# Patient Record
Sex: Female | Born: 1950
Health system: Southern US, Community
[De-identification: ages and names within clinical notes are randomized; demographics above are authoritative.]

## PROBLEM LIST (undated history)

## (undated) DIAGNOSIS — K5792 Diverticulitis of intestine, part unspecified, without perforation or abscess without bleeding: Secondary | ICD-10-CM

## (undated) DIAGNOSIS — K219 Gastro-esophageal reflux disease without esophagitis: Secondary | ICD-10-CM

## (undated) DIAGNOSIS — E785 Hyperlipidemia, unspecified: Secondary | ICD-10-CM

## (undated) DIAGNOSIS — I38 Endocarditis, valve unspecified: Secondary | ICD-10-CM

## (undated) HISTORY — DX: Hyperlipidemia, unspecified: E78.5

## (undated) HISTORY — PX: ABDOMINAL HYSTERECTOMY: SHX81

---

## 2010-08-25 ENCOUNTER — Emergency Department (HOSPITAL_COMMUNITY)
Admission: EM | Admit: 2010-08-25 | Discharge: 2010-08-25 | Disposition: A | Discharge status: Home / Self Care | Payer: Self-pay | Attending: Emergency Medicine | Admitting: Emergency Medicine

## 2010-08-25 ENCOUNTER — Emergency Department (HOSPITAL_COMMUNITY): Payer: Self-pay

## 2010-08-25 DIAGNOSIS — M1710 Unilateral primary osteoarthritis, unspecified knee: Secondary | ICD-10-CM | POA: Insufficient documentation

## 2010-08-25 DIAGNOSIS — IMO0002 Reserved for concepts with insufficient information to code with codable children: Secondary | ICD-10-CM | POA: Insufficient documentation

## 2010-08-25 DIAGNOSIS — X500XXA Overexertion from strenuous movement or load, initial encounter: Secondary | ICD-10-CM | POA: Insufficient documentation

## 2010-08-25 DIAGNOSIS — I10 Essential (primary) hypertension: Secondary | ICD-10-CM | POA: Insufficient documentation

## 2013-01-16 ENCOUNTER — Emergency Department (HOSPITAL_COMMUNITY): Payer: BC Managed Care – PPO

## 2013-01-16 ENCOUNTER — Encounter (HOSPITAL_COMMUNITY): Payer: Self-pay | Admitting: Emergency Medicine

## 2013-01-16 ENCOUNTER — Emergency Department (HOSPITAL_COMMUNITY)
Admission: EM | Admit: 2013-01-16 | Discharge: 2013-01-16 | Disposition: A | Discharge status: Home / Self Care | Payer: BC Managed Care – PPO | Attending: Emergency Medicine | Admitting: Emergency Medicine

## 2013-01-16 DIAGNOSIS — R0602 Shortness of breath: Secondary | ICD-10-CM | POA: Insufficient documentation

## 2013-01-16 DIAGNOSIS — Z880 Allergy status to penicillin: Secondary | ICD-10-CM | POA: Insufficient documentation

## 2013-01-16 DIAGNOSIS — R42 Dizziness and giddiness: Secondary | ICD-10-CM | POA: Insufficient documentation

## 2013-01-16 DIAGNOSIS — R109 Unspecified abdominal pain: Secondary | ICD-10-CM | POA: Insufficient documentation

## 2013-01-16 DIAGNOSIS — K219 Gastro-esophageal reflux disease without esophagitis: Secondary | ICD-10-CM | POA: Insufficient documentation

## 2013-01-16 DIAGNOSIS — Z79899 Other long term (current) drug therapy: Secondary | ICD-10-CM | POA: Insufficient documentation

## 2013-01-16 DIAGNOSIS — R197 Diarrhea, unspecified: Secondary | ICD-10-CM | POA: Insufficient documentation

## 2013-01-16 DIAGNOSIS — R002 Palpitations: Secondary | ICD-10-CM | POA: Insufficient documentation

## 2013-01-16 DIAGNOSIS — Z7982 Long term (current) use of aspirin: Secondary | ICD-10-CM | POA: Insufficient documentation

## 2013-01-16 HISTORY — DX: Diverticulitis of intestine, part unspecified, without perforation or abscess without bleeding: K57.92

## 2013-01-16 HISTORY — DX: Gastro-esophageal reflux disease without esophagitis: K21.9

## 2013-01-16 LAB — BASIC METABOLIC PANEL
BUN: 13 mg/dL (ref 6–23)
CO2: 23 mEq/L (ref 19–32)
Chloride: 104 mEq/L (ref 96–112)
Creatinine, Ser: 0.76 mg/dL (ref 0.50–1.10)
GFR calc Af Amer: >90 mL/min (ref >90)
Potassium: 3.6 mEq/L (ref 3.5–5.1)

## 2013-01-16 LAB — CBC
HCT: 42.7 % (ref 36.0–46.0)
MCV: 84.6 fL (ref 78.0–100.0)
Platelets: 226 10*3/uL (ref 150–400)
RBC: 5.05 MIL/uL (ref 3.87–5.11)
WBC: 9.5 10*3/uL (ref 4.0–10.5)

## 2013-01-16 LAB — POCT I-STAT TROPONIN I

## 2013-01-16 LAB — PRO B NATRIURETIC PEPTIDE: Pro B Natriuretic peptide (BNP): 42 pg/mL (ref 0–125)

## 2013-01-16 NOTE — ED Notes (Addendum)
Pt c/o intermittent palpitations that are associated with dizziness, nausea, sob since Friday, reports one episode of chest tightness this am that went away after 10 minutes. Pt NSR on arrival to er, states that the palpitations are better at present, pt states that she switched her Genella Rife medication to nexium three weeks ago and wonders if the palpitations are a side effect.

## 2013-01-16 NOTE — ED Provider Notes (Signed)
CSN: [id number]     Arrival date & time 01/16/13  1108 History  This chart was scribed for Flint Melter, MD by Quintella Reichert, ED scribe.  This patient was seen in room APA10/APA10 and the patient's care was started at 11:45 AM.   Chief Complaint  Patient presents with  . Palpitations    The history is provided by the patient. No language interpreter was used.    HPI Comments: Sierra Greer is a 62 y.o. female who presents to the Emergency Department complaining of 2 days of intermittent heart palpitations with associated dizziness and SOB.  Pt describes palpitations as "my heart is doing a flippity floppity thing."  Dizziness is present whenever the palpitations occur even if she is at rest.  It has not prevented her from walking.  She denies prior h/o heart problems or persistent palpitations.  Pt also notes she has had intermittent severe sharp lower abdominal pain over the past week which has since resolved.  She has h/o diverticulitis but states this pain felt distinct.  She also complains of diarrhea.  Pt takes 2 low-dose aspirin/day.  Surgical history is limited to an abdominal hysterectomy in 1995.    Pt has no PCP.   Past Medical History  Diagnosis Date  . GERD (gastroesophageal reflux disease)   . Diverticulitis     Past Surgical History  Procedure Laterality Date  . Abdominal hysterectomy      No family history on file.   History  Substance Use Topics  . Smoking status: Never Smoker   . Smokeless tobacco: Not on file  . Alcohol Use: No    OB History   Grav Para Term Preterm Abortions TAB SAB Ect Mult Living                  Review of Systems  Respiratory: Positive for shortness of breath.   Cardiovascular: Positive for palpitations.  Gastrointestinal: Positive for abdominal pain and diarrhea.  Neurological: Positive for dizziness.  All other systems reviewed and are negative.     Allergies  Penicillins  Home Medications   Current Outpatient  Rx  Name  Route  Sig  Dispense  Refill  . aspirin EC 81 MG tablet   Oral   Take 162 mg by mouth daily.         . diphenhydrAMINE (BENADRYL) 25 MG tablet   Oral   Take 50 mg by mouth every 6 (six) hours as needed (Swelling, breathing).         [name] esomeprazole (NEXIUM) 20 MG capsule   Oral   Take 20 mg by mouth daily.         . Nutritional Supplements (ESTROVEN PO)   Oral   Take 1 tablet by mouth daily.           BP 148/71  Pulse 71  Temp(Src) 97.9 F (36.6 C) (Oral)  Resp 20  Ht 5\' 5"  (1.651 m)  Wt 250 lb (113.399 kg)  BMI 41.60 kg/m2  SpO2 100%  Physical Exam  Nursing note and vitals reviewed. Constitutional: She is oriented to person, place, and time. She appears well-developed and well-nourished.  HENT:  Head: Normocephalic and atraumatic.  Eyes: Conjunctivae and EOM are normal. Pupils are equal, round, and reactive to light.  Neck: Normal range of motion and phonation normal. Neck supple.  Cardiovascular: Normal rate, regular rhythm and intact distal pulses.   Pulmonary/Chest: Effort normal and breath sounds normal. She exhibits no tenderness.  Abdominal:  Soft. She exhibits no distension. There is no tenderness. There is no guarding.  Musculoskeletal: Normal range of motion.  Neurological: She is alert and oriented to person, place, and time. She exhibits normal muscle tone.  Skin: Skin is warm and dry.  Psychiatric: She has a normal mood and affect. Her behavior is normal. Judgment and thought content normal.    ED Course  Procedures (including critical care time)   Patient Vitals for the past 24 hrs:  BP Temp Temp src Pulse Resp SpO2 Height Weight  01/16/13 1400 128/55 mmHg - - 73 17 98 % - -  01/16/13 1336 137/63 mmHg 98.1 F (36.7 C) Oral 70 18 98 % - -  01/16/13 1200 131/59 mmHg - - 72 15 98 % - -  01/16/13 1124 148/71 mmHg 97.9 F (36.6 C) Oral 71 20 100 % 5\' 5"  (1.651 m) 250 lb (113.399 kg)    DIAGNOSTIC STUDIES: Oxygen Saturation is 100%  on room air, normal by my interpretation.    COORDINATION OF CARE: 11:52 AM-Discussed treatment plan which includes cardiac workup with pt at bedside and pt agreed to plan.    EKG Interpretation     Ventricular Rate:  69 PR Interval:  162 QRS Duration: 84 QT Interval:  406 QTC Calculation: 435 R Axis:   34 Text Interpretation:  Normal sinus rhythm Low voltage QRS Borderline ECG No previous ECGs available            Results for orders placed during the hospital encounter of 01/16/13  CBC      Result Value Range   WBC 9.5  4.0 - 10.5 K/uL   RBC 5.05  3.87 - 5.11 MIL/uL   Hemoglobin 14.5  12.0 - 15.0 g/dL   HCT 16.1  09.6 - 04.5 %   MCV 84.6  78.0 - 100.0 fL   MCH 28.7  26.0 - 34.0 pg   MCHC 34.0  30.0 - 36.0 g/dL   RDW 40.9  81.1 - 91.4 %   Platelets 226  150 - 400 K/uL  BASIC METABOLIC PANEL      Result Value Range   Sodium 138  135 - 145 mEq/L   Potassium 3.6  3.5 - 5.1 mEq/L   Chloride 104  96 - 112 mEq/L   CO2 23  19 - 32 mEq/L   Glucose, Bld 98  70 - 99 mg/dL   BUN 13  6 - 23 mg/dL   Creatinine, Ser 7.82  0.50 - 1.10 mg/dL   Calcium 9.8  8.4 - 95.6 mg/dL   GFR calc non Af Amer 88 (*) >90 mL/min   GFR calc Af Amer >90  >90 mL/min  PRO B NATRIURETIC PEPTIDE      Result Value Range   Pro B Natriuretic peptide (BNP) 42.0  0 - 125 pg/mL  POCT I-STAT TROPONIN I      Result Value Range   Troponin i, poc 0.00  0.00 - 0.08 ng/mL   Comment 3             Dg Chest Port 1 View  01/16/2013   CLINICAL DATA:  Palpitations  EXAM: PORTABLE CHEST - 1 VIEW  COMPARISON:  None.  FINDINGS: Cardiomegaly is noted. No acute infiltrate or pleural effusion. No pulmonary edema. Mild elevation of the right hemidiaphragm.  IMPRESSION: Cardiomegaly. No active disease.   Electronically Signed   By: Natasha Mead M.D.   On: 01/16/2013 12:03     MDM  1. Palpitations    Palpitations with nonspecific dizziness, which is not prolonged. The patient is stable for discharge. There is no  evidence for metabolic instability, significant cardiac abnormality, or vascular instability.  Nursing Notes Reviewed/ Care Coordinated, and agree without changes. Applicable Imaging Reviewed.  Interpretation of Laboratory Data incorporated into ED treatment   Plan: Home Medications- usual; Home Treatments and Observation- rest; return here if the recommended treatment, does not improve the symptoms; Recommended follow up- PCP of choice in one week for further evaluation and treatment       I personally performed the services described in this documentation, which was scribed in my presence. The recorded information has been reviewed and is accurate.      Flint Melter, MD 01/16/13 1536

## 2013-01-18 ENCOUNTER — Ambulatory Visit: Payer: Self-pay | Admitting: Family Medicine

## 2013-01-21 ENCOUNTER — Ambulatory Visit (INDEPENDENT_AMBULATORY_CARE_PROVIDER_SITE_OTHER): Payer: BC Managed Care – PPO | Admitting: Family Medicine

## 2013-01-21 ENCOUNTER — Encounter: Payer: Self-pay | Admitting: Family Medicine

## 2013-01-21 VITALS — BP 147/74 | HR 78 | Temp 97.1°F | Ht 63.0 in | Wt 265.6 lb

## 2013-01-21 DIAGNOSIS — Z0000 Encounter for general adult medical examination without abnormal findings: Secondary | ICD-10-CM

## 2013-01-21 DIAGNOSIS — Z8719 Personal history of other diseases of the digestive system: Secondary | ICD-10-CM

## 2013-01-21 DIAGNOSIS — R002 Palpitations: Secondary | ICD-10-CM

## 2013-01-21 LAB — POCT CBC
Granulocyte percent: 58.8 %G (ref 37–80)
HCT, POC: 45.7 % (ref 37.7–47.9)
Hemoglobin: 14.8 g/dL (ref 12.2–16.2)
Lymph, poc: 3.5 — AB (ref 0.6–3.4)
MCH, POC: 27.3 pg (ref 27–31.2)
MCHC: 32.4 g/dL (ref 31.8–35.4)
MCV: 84.1 fL (ref 80–97)
MPV: 8.4 fL (ref 0–99.8)
POC Granulocyte: 5.2 (ref 2–6.9)
POC LYMPH PERCENT: 39.4 %L (ref 10–50)
Platelet Count, POC: 232 10*3/uL (ref 142–424)
RBC: 5.4 M/uL (ref 4.04–5.48)
RDW, POC: 12.9 %
WBC: 8.9 10*3/uL (ref 4.6–10.2)

## 2013-01-21 NOTE — Progress Notes (Signed)
  Subjective:    Patient ID: Sierra Greer, female    DOB: 12-11-1950, 62 y.o.   MRN: [id number]  HPI  This 62 y.o. female presents for evaluation of heart palpitations.  She was seen in the ED at [facility] on 01/16/13 and underwent EKG, CE's, cardiac telemetry and All were normal.  She was DC'd from ED.  She states she did get dizzy when she had The heart palpitations.  Review of Systems C/o heart palpitations No chest pain, SOB, HA, dizziness, vision change, N/V, diarrhea, constipation, dysuria, urinary urgency or frequency, myalgias, arthralgias or rash.     Objective:   Physical Exam  Vital signs noted  Well developed well nourished female.  HEENT - Head atraumatic Normocephalic                Eyes - PERRLA, Conjuctiva - clear Sclera- Clear EOMI                Ears - EAC's Wnl TM's Wnl Gross Hearing WNL                Nose - Nares patent                 Throat - oropharanx wnl Respiratory - Lungs CTA bilateral Cardiac - RRR S1 and S2 without murmur GI - Abdomen soft Nontender and bowel sounds active x 4 Extremities - No edema. Neuro - Grossly intact.      Assessment & Plan:   Palpitations - Plan: POCT CBC, CMP14+EGFR, Thyroid Panel With TSH  Routine general medical examination at a health care facility - Plan: POCT CBC, CMP14+EGFR, Thyroid Panel With TSH, Lipid panel, Vit D  25 hydroxy (rtn osteoporosis monitoring), Ambulatory referral to Cardiology  Deatra Canter FNP

## 2013-01-22 LAB — THYROID PANEL WITH TSH
Free Thyroxine Index: 3 (ref 1.2–4.9)
T3 Uptake Ratio: 27 % (ref 24–39)
T4, Total: 11 ug/dL (ref 4.5–12.0)
TSH: 1.46 u[IU]/mL (ref 0.450–4.500)

## 2013-01-22 LAB — LIPID PANEL
Chol/HDL Ratio: 4.3 ratio units (ref 0.0–4.4)
Cholesterol, Total: 233 mg/dL — ABNORMAL HIGH (ref 100–199)
HDL: 54 mg/dL (ref >39)
LDL Calculated: 141 mg/dL — ABNORMAL HIGH (ref 0–99)
Triglycerides: 191 mg/dL — ABNORMAL HIGH (ref 0–149)
VLDL Cholesterol Cal: 38 mg/dL (ref 5–40)

## 2013-01-22 LAB — CMP14+EGFR
ALT: 23 IU/L (ref 0–32)
AST: 23 IU/L (ref 0–40)
Albumin/Globulin Ratio: 1.4 (ref 1.1–2.5)
Albumin: 4.1 g/dL (ref 3.6–4.8)
Alkaline Phosphatase: 71 IU/L (ref 39–117)
BUN/Creatinine Ratio: 13 (ref 11–26)
BUN: 10 mg/dL (ref 8–27)
CO2: 24 mmol/L (ref 18–29)
Calcium: 9.8 mg/dL (ref 8.6–10.2)
Chloride: 102 mmol/L (ref 97–108)
Creatinine, Ser: 0.77 mg/dL (ref 0.57–1.00)
GFR calc Af Amer: 95 mL/min/{1.73_m2} (ref >59)
GFR calc non Af Amer: 82 mL/min/{1.73_m2} (ref >59)
Globulin, Total: 2.9 g/dL (ref 1.5–4.5)
Glucose: 99 mg/dL (ref 65–99)
Potassium: 4.4 mmol/L (ref 3.5–5.2)
Sodium: 141 mmol/L (ref 134–144)
Total Bilirubin: <0.2 mg/dL (ref 0.0–1.2)
Total Protein: 7 g/dL (ref 6.0–8.5)

## 2013-01-22 LAB — VITAMIN D 25 HYDROXY (VIT D DEFICIENCY, FRACTURES): Vit D, 25-Hydroxy: 22.6 ng/mL — ABNORMAL LOW (ref 30.0–100.0)

## 2013-01-24 ENCOUNTER — Other Ambulatory Visit: Payer: Self-pay | Admitting: Family Medicine

## 2013-01-24 MED ORDER — PRAVASTATIN SODIUM 40 MG PO TABS: 40.0000 mg | ORAL_TABLET | Freq: Every day | ORAL | Status: DC

## 2013-01-24 MED ORDER — VITAMIN D (ERGOCALCIFEROL) 1.25 MG (50000 UNIT) PO CAPS: 50000.0000 [IU] | ORAL_CAPSULE | ORAL | Status: DC

## 2013-01-24 NOTE — Patient Instructions (Signed)
Palpitations   A palpitation is the feeling that your heartbeat is irregular or is faster than normal. It may feel like your heart is fluttering or skipping a beat. Palpitations are usually not a serious problem. However, in some cases, you may need further medical evaluation.  CAUSES   Palpitations can be caused by:   Smoking.   Caffeine or other stimulants, such as diet pills or energy drinks.   Alcohol.   Stress and anxiety.   Strenuous physical activity.   Fatigue.   Certain medicines.   Heart disease, especially if you have a history of arrhythmias. This includes atrial fibrillation, atrial flutter, or supraventricular tachycardia.   An improperly working pacemaker or defibrillator.  DIAGNOSIS   To find the cause of your palpitations, your caregiver will take your history and perform a physical exam. Tests may also be done, including:   Electrocardiography (ECG). This test records the heart's electrical activity.   Cardiac monitoring. This allows your caregiver to monitor your heart rate and rhythm in real time.   Holter monitor. This is a portable device that records your heartbeat and can help diagnose heart arrhythmias. It allows your caregiver to track your heart activity for several days, if needed.   Stress tests by exercise or by giving medicine that makes the heart beat faster.  TREATMENT   Treatment of palpitations depends on the cause of your symptoms and can vary greatly. Most cases of palpitations do not require any treatment other than time, relaxation, and monitoring your symptoms. Other causes, such as atrial fibrillation, atrial flutter, or supraventricular tachycardia, usually require further treatment.  HOME CARE INSTRUCTIONS    Avoid:   Caffeinated coffee, tea, soft drinks, diet pills, and energy drinks.   Chocolate.   Alcohol.   Stop smoking if you smoke.   Reduce your stress and anxiety. Things that can help you relax include:   A method that measures bodily functions so  you can learn to control them (biofeedback).   Yoga.   Meditation.   Physical activity such as swimming, jogging, or walking.   Get plenty of rest and sleep.  SEEK MEDICAL CARE IF:    You continue to have a fast or irregular heartbeat beyond 24 hours.   Your palpitations occur more often.  SEEK IMMEDIATE MEDICAL CARE IF:   You develop chest pain or shortness of breath.   You have a severe headache.   You feel dizzy, or you faint.  MAKE SURE YOU:   Understand these instructions.   Will watch your condition.   Will get help right away if you are not doing well or get worse.  Document Released: 02/22/2000 Document Revised: 06/21/2012 Document Reviewed: 04/25/2011  [employer] Patient Information 2014 [employer].

## 2013-03-16 ENCOUNTER — Ambulatory Visit (INDEPENDENT_AMBULATORY_CARE_PROVIDER_SITE_OTHER): Payer: BC Managed Care – PPO | Admitting: Cardiology

## 2013-03-16 ENCOUNTER — Encounter: Payer: Self-pay | Admitting: *Deleted

## 2013-03-16 ENCOUNTER — Encounter: Payer: Self-pay | Admitting: Cardiology

## 2013-03-16 VITALS — BP 150/82 | HR 78 | Ht 63.0 in | Wt 271.8 lb

## 2013-03-16 DIAGNOSIS — R002 Palpitations: Secondary | ICD-10-CM

## 2013-03-16 DIAGNOSIS — E785 Hyperlipidemia, unspecified: Secondary | ICD-10-CM | POA: Insufficient documentation

## 2013-03-16 NOTE — Patient Instructions (Addendum)
Your physician has requested that you have an echocardiogram. Echocardiography is a painless test that uses sound waves to create images of your heart. It provides your doctor with information about the size and shape of your heart and how well your heart's chambers and valves are working. This procedure takes approximately one hour. There are no restrictions for this procedure.  Your physician has recommended that you wear a holter monitor. Holter monitors are medical devices that record the heart's electrical activity. Doctors most often use these monitors to diagnose arrhythmias. Arrhythmias are problems with the speed or rhythm of the heartbeat. The monitor is a small, portable device. You can wear one while you do your normal daily activities. This is usually used to diagnose what is causing palpitations/syncope (passing out). 24 HOUR  Your physician recommends that you schedule a follow-up appointment as needed with Dr Shirlee LatchMcLean.   [facility] [phone]

## 2013-03-16 NOTE — Progress Notes (Signed)
Patient ID: Sierra ScrapeLinda Greer, female   DOB: 04/11/1950, 63 y.o.   MRN: [id number] PCP: Nils PyleWilliam Oxford  63 yo presents for evaluation of palpitations.  In 11/14, she was under a fair amount of stress and having to lift heavy loads at work which was difficult for her. One day, she felt her heart start periodically "flipping" in her chest.  She would feel lightheaded when her heart was "flipping."  This happened periodically for about a week. She went to [facility] and was noted to have PVCs on monitoring.  The symptoms then resolved and have not come back.  Currently, she is doing well.  She is under less stress at work.  She denies chest pain or dyspnea.  She has occasional anxiety attacks.  She does not drink much caffeine.    ECG: NSR, normal  Labs (11/14): TSH normal, K 4.4, creatinine 0.77, LDL 141, HDL 54  PMH: 1. Hyperlipidemia 2. Palpitations: PVCs noted on prior monitoring.  3. Obesity  FH: No history of heart disease. Father with asbestosis.   SH: [place] [address], nonsmoker.   ROS: All systems reviewed and negative except as per HPI.   Current Outpatient Prescriptions  Medication Sig Dispense Refill  . aspirin EC 81 MG tablet Take 162 mg by mouth daily.      . diphenhydrAMINE (BENADRYL) 25 MG tablet Take 50 mg by mouth every 6 (six) hours as needed (Swelling, breathing).      [name]. esomeprazole (NEXIUM) 20 MG capsule Take 20 mg by mouth daily.      . Nutritional Supplements (ESTROVEN PO) Take 1 tablet by mouth daily.      . vitamin E (VITAMIN E) 400 UNIT capsule Take 400 Units by mouth daily.       No current facility-administered medications for this visit.    BP 150/82  Pulse 78  Ht 5\' 3"  (1.6 m)  Wt 123.288 kg (271 lb 12.8 oz)  BMI 48.16 kg/m2 General: NAD, obese Neck: No JVD, no thyromegaly or thyroid nodule.  Lungs: Clear to auscultation bilaterally with normal respiratory effort. CV: Nondisplaced PMI.  Heart regular S1/S2, no S3/S4, no  murmur.  No peripheral edema.  No carotid bruit.  Normal pedal pulses.  Abdomen: Soft, nontender, no hepatosplenomegaly, no distention.  Skin: Intact without lesions or rashes.  Neurologic: Alert and oriented x 3.  Psych: Normal affect. Extremities: No clubbing or cyanosis.  HEENT: Normal.   Assessment/Plan: 1. Palpitations: PVCs noted on monitoring [facility].  I suspect this is what caused her symptoms.  She is under less stress now and palpitations have resolved.  She does not drink much caffeine. TSH was normal in 11/14.  - Will get holter to assess rhythm.  Her symptoms have mostly resolved so hopefully no significant arrhythmias will be found.  2. Hyperlipidemia: She is going to start pravastatin.  3 .Obesity: She needs diet/exercise to work on weight loss.    Marca AnconaDalton Shericka Johnstone 03/16/2013  - Given PVCs, will get echo to assess for structural abnormalities.

## 2013-03-31 ENCOUNTER — Other Ambulatory Visit (HOSPITAL_COMMUNITY): Payer: BC Managed Care – PPO

## 2013-05-23 ENCOUNTER — Encounter: Payer: Self-pay | Admitting: Family Medicine

## 2013-05-23 ENCOUNTER — Ambulatory Visit (INDEPENDENT_AMBULATORY_CARE_PROVIDER_SITE_OTHER): Payer: BC Managed Care – PPO | Admitting: Family Medicine

## 2013-05-23 VITALS — BP 150/79 | HR 72 | Temp 97.5°F | Ht 63.0 in | Wt 265.2 lb

## 2013-05-23 DIAGNOSIS — R002 Palpitations: Secondary | ICD-10-CM

## 2013-05-23 DIAGNOSIS — E785 Hyperlipidemia, unspecified: Secondary | ICD-10-CM

## 2013-05-23 DIAGNOSIS — I10 Essential (primary) hypertension: Secondary | ICD-10-CM

## 2013-05-23 DIAGNOSIS — R079 Chest pain, unspecified: Secondary | ICD-10-CM

## 2013-05-23 MED ORDER — METOPROLOL SUCCINATE ER 25 MG PO TB24: 25.0000 mg | ORAL_TABLET | Freq: Every day | ORAL | Status: DC

## 2013-05-23 MED ORDER — PRAVASTATIN SODIUM 40 MG PO TABS: 40.0000 mg | ORAL_TABLET | Freq: Every day | ORAL | Status: DC

## 2013-05-23 NOTE — Progress Notes (Signed)
   Subjective:    Patient ID: Sierra Greer, female    DOB: 06/11/1950, 63 y.o.   MRN: [id number]  HPI This 63 y.o. female presents for evaluation of follow up on PVC's a few weeks ago. She went to the ED and she had negatvie CE's. She was DC'd from the hospital. She states she was referred to a cardiologist Dr. Marca Anconaalton Mclean and she did not  Follow up. She is having some chest pain on occasion. She states her father had  Heart disease.  She notices these palpitations and chest discomfort with exertion.    Review of Systems C/o palpitations and chest tightness. No  SOB, HA, dizziness, vision change, N/V, diarrhea, constipation, dysuria, urinary urgency or frequency, myalgias, arthralgias or rash.     Objective:   Physical Exam  Vital signs noted  Well developed well nourished obese female.  HEENT - Head atraumatic Normocephalic                Eyes - PERRLA, Conjuctiva - clear Sclera- Clear EOMI                Ears - EAC's Wnl TM's Wnl Gross Hearing WNL                Throat - oropharanx wnl Respiratory - Lungs CTA bilateral Cardiac - RRR S1 and S2 without murmur GI - Abdomen soft Nontender and bowel sounds active x 4 Extremities - No edema. Neuro - Grossly intact.      Assessment & Plan:  Chest pain - Plan: Ambulatory referral to Cardiology  Palpitations - Plan: Ambulatory referral to Cardiology  Essential hypertension, benign - Plan: metoprolol succinate (TOPROL-XL) 25 MG 24 hr tablet  Other and unspecified hyperlipidemia - Plan: pravastatin (PRAVACHOL) 40 MG tablet  Deatra CanterWilliam J Veto Macqueen FNP

## 2013-05-23 NOTE — Patient Instructions (Signed)
Chest Pain (Nonspecific) °It is often hard to give a specific diagnosis for the cause of chest pain. There is always a chance that your pain could be related to something serious, such as a heart attack or a blood clot in the lungs. You need to follow up with your caregiver for further evaluation. °CAUSES  °· Heartburn. °· Pneumonia or bronchitis. °· Anxiety or stress. °· Inflammation around your heart (pericarditis) or lung (pleuritis or pleurisy). °· A blood clot in the lung. °· A collapsed lung (pneumothorax). It can develop suddenly on its own (spontaneous pneumothorax) or from injury (trauma) to the chest. °· Shingles infection (herpes zoster virus). °The chest wall is composed of bones, muscles, and cartilage. Any of these can be the source of the pain. °· The bones can be bruised by injury. °· The muscles or cartilage can be strained by coughing or overwork. °· The cartilage can be affected by inflammation and become sore (costochondritis). °DIAGNOSIS  °Lab tests or other studies, such as X-rays, electrocardiography, stress testing, or cardiac imaging, may be needed to find the cause of your pain.  °TREATMENT  °· Treatment depends on what may be causing your chest pain. Treatment may include: °· Acid blockers for heartburn. °· Anti-inflammatory medicine. °· Pain medicine for inflammatory conditions. °· Antibiotics if an infection is present. °· You may be advised to change lifestyle habits. This includes stopping smoking and avoiding alcohol, caffeine, and chocolate. °· You may be advised to keep your head raised (elevated) when sleeping. This reduces the chance of acid going backward from your stomach into your esophagus. °· Most of the time, nonspecific chest pain will improve within 2 to 3 days with rest and mild pain medicine. °HOME CARE INSTRUCTIONS  °· If antibiotics were prescribed, take your antibiotics as directed. Finish them even if you start to feel better. °· For the next few days, avoid physical  activities that bring on chest pain. Continue physical activities as directed. °· Do not smoke. °· Avoid drinking alcohol. °· Only take over-the-counter or prescription medicine for pain, discomfort, or fever as directed by your caregiver. °· Follow your caregiver's suggestions for further testing if your chest pain does not go away. °· Keep any follow-up appointments you made. If you do not go to an appointment, you could develop lasting (chronic) problems with pain. If there is any problem keeping an appointment, you must call to reschedule. °SEEK MEDICAL CARE IF:  °· You think you are having problems from the medicine you are taking. Read your medicine instructions carefully. °· Your chest pain does not go away, even after treatment. °· You develop a rash with blisters on your chest. °SEEK IMMEDIATE MEDICAL CARE IF:  °· You have increased chest pain or pain that spreads to your arm, neck, jaw, back, or abdomen. °· You develop shortness of breath, an increasing cough, or you are coughing up blood. °· You have severe back or abdominal pain, feel nauseous, or vomit. °· You develop severe weakness, fainting, or chills. °· You have a fever. °THIS IS AN EMERGENCY. Do not wait to see if the pain will go away. Get medical help at once. Call your local emergency services (911 in [place] Do not drive yourself to the hospital. °MAKE SURE YOU:  °· Understand these instructions. °· Will watch your condition. °· Will get help right away if you are not doing well or get worse. °Document Released: 12/04/2004 Document Revised: 05/19/2011 Document Reviewed: 09/30/2007 °Exit[employer]® Patient Information ©2014 [employer]   [employer]. ° °

## 2013-07-29 ENCOUNTER — Ambulatory Visit (INDEPENDENT_AMBULATORY_CARE_PROVIDER_SITE_OTHER): Payer: BC Managed Care – PPO | Admitting: Cardiology

## 2013-07-29 ENCOUNTER — Encounter: Payer: Self-pay | Admitting: *Deleted

## 2013-07-29 ENCOUNTER — Encounter: Payer: Self-pay | Admitting: Cardiology

## 2013-07-29 VITALS — BP 132/88 | HR 80 | Ht 63.0 in | Wt 258.8 lb

## 2013-07-29 DIAGNOSIS — R002 Palpitations: Secondary | ICD-10-CM

## 2013-07-29 DIAGNOSIS — E785 Hyperlipidemia, unspecified: Secondary | ICD-10-CM

## 2013-07-29 DIAGNOSIS — R0602 Shortness of breath: Secondary | ICD-10-CM

## 2013-07-29 NOTE — Patient Instructions (Signed)
Your physician has requested that you have an echocardiogram. Echocardiography is a painless test that uses sound waves to create images of your heart. It provides your doctor with information about the size and shape of your heart and how well your heart's chambers and valves are working. This procedure takes approximately one hour. There are no restrictions for this procedure.  Your physician has recommended that you wear a holter monitor. Holter monitors are medical devices that record the heart's electrical activity. Doctors most often use these monitors to diagnose arrhythmias. Arrhythmias are problems with the speed or rhythm of the heartbeat. The monitor is a small, portable device. You can wear one while you do your normal daily activities. This is usually used to diagnose what is causing palpitations/syncope (passing out). 24 HOUR  Your physician recommends that you schedule a follow-up appointment as needed with Dr Shirlee Latch.

## 2013-07-30 NOTE — Progress Notes (Signed)
Patient ID: Sierra ScrapeLinda Greer, female   DOB: 09/23/1950, 63 y.o.   MRN: [id number] PCP: Nils PyleWilliam Oxford  63 yo returns for evaluation of palpitations.  In 11/14, she was under a fair amount of stress and having to lift heavy loads at work which was difficult for her. One day, she felt her heart start periodically "flipping" in her chest.  She would feel lightheaded when her heart was "flipping."  This happened periodically for about a week. She went to the [facility] and was noted to have PVCs on monitoring.  The symptoms resolved for the most part in a few days.  Currently, she has rare mild palpitations.  No chest pain.  She has mild dyspnea only with heavy exertion.  She can climb a flight of steps without problems.  Main limitation is knee pain.  At last appointment, I recommended a 24 hour holter to quantify PVCs and an echocardiogram.  She did not have them done but her PCP told her to come back and have her workup, so she is here again today.   Labs (11/14): TSH normal, K 4.4, creatinine 0.77, LDL 141, HDL 54  PMH: 1. Hyperlipidemia 2. Palpitations: PVCs noted on prior monitoring.  3. Obesity  FH: No history of heart disease. Father with asbestosis.   SH: [place] [address] [place], nonsmoker.   ROS: All systems reviewed and negative except as per HPI.   Current Outpatient Prescriptions  Medication Sig Dispense Refill  . aspirin EC 81 MG tablet Take 162 mg by mouth daily.      . Black Cohosh 40 MG CAPS Take 1 capsule by mouth daily.      . Cholecalciferol (VITAMIN D) 1000 UNITS capsule Take 2,000 Units by mouth daily.      . diphenhydrAMINE (BENADRYL) 25 MG tablet Take 50 mg by mouth every 6 (six) hours as needed (Swelling, breathing).      [name]. esomeprazole (NEXIUM) 20 MG capsule Take 20 mg by mouth daily.      . metoprolol succinate (TOPROL-XL) 25 MG 24 hr tablet Take 1 tablet (25 mg total) by mouth daily.  30 tablet  11  . pravastatin (PRAVACHOL) 40 MG tablet Take 1  tablet (40 mg total) by mouth daily.  30 tablet  11   No current facility-administered medications for this visit.    BP 132/88  Pulse 80  Ht 5\' 3"  (1.6 m)  Wt 258 lb 12.8 oz (117.391 kg)  BMI 45.86 kg/m2 General: NAD, obese Neck: No JVD, no thyromegaly or thyroid nodule.  Lungs: Clear to auscultation bilaterally with normal respiratory effort. CV: Nondisplaced PMI.  Heart regular S1/S2, no S3/S4, no murmur.  No peripheral edema.  No carotid bruit.  Normal pedal pulses.  Abdomen: Soft, nontender, no hepatosplenomegaly, no distention.  Skin: Intact without lesions or rashes.  Neurologic: Alert and oriented x 3.  Psych: Normal affect. Extremities: No clubbing or cyanosis.   Assessment/Plan: 1. Palpitations: PVCs noted on monitoring at [facility].  I suspect this is what caused her symptoms of palpitations.  She is under less stress now and palpitations have resolved. TSH was normal in 11/14.  She is on Toprol XL which does seem to be suppressing palpitations.  - Will get holter to assess rhythm.  Her symptoms have mostly resolved so hopefully no significant arrhythmias will be found.  - Given the PVCs, will get echo to evaluate for cardiac structural abnormalities.  2. Hyperlipidemia: She is on pravastatin.  3 .Obesity: She needs diet/exercise to work on weight loss.    [name] 07/30/2013

## 2013-08-16 ENCOUNTER — Encounter: Payer: Self-pay | Admitting: *Deleted

## 2013-08-16 ENCOUNTER — Ambulatory Visit (HOSPITAL_COMMUNITY): Payer: BC Managed Care – PPO | Attending: Cardiology | Admitting: Radiology

## 2013-08-16 ENCOUNTER — Encounter (INDEPENDENT_AMBULATORY_CARE_PROVIDER_SITE_OTHER): Payer: BC Managed Care – PPO

## 2013-08-16 DIAGNOSIS — R0609 Other forms of dyspnea: Secondary | ICD-10-CM | POA: Insufficient documentation

## 2013-08-16 DIAGNOSIS — R002 Palpitations: Secondary | ICD-10-CM

## 2013-08-16 DIAGNOSIS — R0602 Shortness of breath: Secondary | ICD-10-CM

## 2013-08-16 DIAGNOSIS — R0989 Other specified symptoms and signs involving the circulatory and respiratory systems: Principal | ICD-10-CM | POA: Insufficient documentation

## 2013-08-16 NOTE — Progress Notes (Signed)
Echocardiogram performed.  

## 2013-08-16 NOTE — Progress Notes (Signed)
Patient ID: Sierra Greer, female   DOB: 1950-07-09, 63 y.o.   MRN: [id number] E-Cardio 24 hour holter monitor applied to patient.

## 2013-08-19 ENCOUNTER — Telehealth: Payer: Self-pay | Admitting: *Deleted

## 2013-08-19 NOTE — Telephone Encounter (Signed)
Dr Shirlee LatchMcLean reviewed monitor done 08/16/13  Rare PACs/PVCs No change to plan.  Pt notified.

## 2015-05-13 ENCOUNTER — Encounter (HOSPITAL_COMMUNITY): Payer: Self-pay

## 2015-05-13 ENCOUNTER — Emergency Department (HOSPITAL_COMMUNITY)
Admission: EM | Admit: 2015-05-13 | Discharge: 2015-05-13 | Disposition: A | Discharge status: Home / Self Care | Payer: Medicare HMO | Attending: Emergency Medicine | Admitting: Emergency Medicine

## 2015-05-13 ENCOUNTER — Emergency Department (HOSPITAL_COMMUNITY): Payer: Medicare HMO

## 2015-05-13 DIAGNOSIS — Z7982 Long term (current) use of aspirin: Secondary | ICD-10-CM | POA: Insufficient documentation

## 2015-05-13 DIAGNOSIS — Z87891 Personal history of nicotine dependence: Secondary | ICD-10-CM | POA: Insufficient documentation

## 2015-05-13 DIAGNOSIS — K047 Periapical abscess without sinus: Secondary | ICD-10-CM | POA: Diagnosis not present

## 2015-05-13 DIAGNOSIS — L03211 Cellulitis of face: Secondary | ICD-10-CM | POA: Diagnosis not present

## 2015-05-13 DIAGNOSIS — R220 Localized swelling, mass and lump, head: Secondary | ICD-10-CM | POA: Diagnosis present

## 2015-05-13 DIAGNOSIS — K130 Diseases of lips: Secondary | ICD-10-CM | POA: Diagnosis not present

## 2015-05-13 DIAGNOSIS — Z79899 Other long term (current) drug therapy: Secondary | ICD-10-CM | POA: Diagnosis not present

## 2015-05-13 DIAGNOSIS — B9689 Other specified bacterial agents as the cause of diseases classified elsewhere: Secondary | ICD-10-CM | POA: Diagnosis not present

## 2015-05-13 HISTORY — DX: Endocarditis, valve unspecified: I38

## 2015-05-13 LAB — CBC WITH DIFFERENTIAL/PLATELET
BASOS PCT: 0 %
Basophils Absolute: 0 10*3/uL (ref 0.0–0.1)
Eosinophils Absolute: 0.2 10*3/uL (ref 0.0–0.7)
Eosinophils Relative: 2 %
HCT: 48.3 % — ABNORMAL HIGH (ref 36.0–46.0)
HEMOGLOBIN: 16 g/dL — AB (ref 12.0–15.0)
Lymphocytes Relative: 26 %
Lymphs Abs: 3.1 10*3/uL (ref 0.7–4.0)
MCH: 28.5 pg (ref 26.0–34.0)
MCHC: 33.1 g/dL (ref 30.0–36.0)
MCV: 86.1 fL (ref 78.0–100.0)
Monocytes Absolute: 0.9 10*3/uL (ref 0.1–1.0)
Monocytes Relative: 8 %
NEUTROS PCT: 64 %
Neutro Abs: 8 10*3/uL — ABNORMAL HIGH (ref 1.7–7.7)
Platelets: 223 10*3/uL (ref 150–400)
RBC: 5.61 MIL/uL — AB (ref 3.87–5.11)
RDW: 12.7 % (ref 11.5–15.5)
WBC: 12.2 10*3/uL — AB (ref 4.0–10.5)

## 2015-05-13 LAB — BASIC METABOLIC PANEL
Anion gap: 9 (ref 5–15)
BUN: 9 mg/dL (ref 6–20)
CHLORIDE: 106 mmol/L (ref 101–111)
CO2: 27 mmol/L (ref 22–32)
CREATININE: 0.94 mg/dL (ref 0.44–1.00)
Calcium: 9.3 mg/dL (ref 8.9–10.3)
GFR calc non Af Amer: >60 mL/min (ref >60)
Glucose, Bld: 116 mg/dL — ABNORMAL HIGH (ref 65–99)
Potassium: 4.8 mmol/L (ref 3.5–5.1)
SODIUM: 142 mmol/L (ref 135–145)

## 2015-05-13 MED ORDER — HYDROCODONE-ACETAMINOPHEN 5-325 MG PO TABS
2.0000 | ORAL_TABLET | Freq: Once | ORAL | Status: AC
  Administered 2015-05-13: 2 via ORAL
  Filled 2015-05-13: qty 2

## 2015-05-13 MED ORDER — CLINDAMYCIN PHOSPHATE 600 MG/50ML IV SOLN
600.0000 mg | Freq: Once | INTRAVENOUS | Status: AC
  Administered 2015-05-13: 600 mg via INTRAVENOUS
  Filled 2015-05-13: qty 50

## 2015-05-13 MED ORDER — HYDROCODONE-ACETAMINOPHEN 5-325 MG PO TABS: 1.0000 | ORAL_TABLET | ORAL | Status: DC | PRN

## 2015-05-13 MED ORDER — CLINDAMYCIN HCL 300 MG PO CAPS: 300.0000 mg | ORAL_CAPSULE | Freq: Three times a day (TID) | ORAL | Status: DC

## 2015-05-13 MED ORDER — IOHEXOL 300 MG/ML  SOLN
75.0000 mL | Freq: Once | INTRAMUSCULAR | Status: AC | PRN
  Administered 2015-05-13: 75 mL via INTRAVENOUS

## 2015-05-13 NOTE — Discharge Instructions (Signed)
Your scan reveals an abscess involving her upper gum and extending to the area behind and in your nose. Please use clindamycin 3 times daily with food. Use Tylenol or ibuprofen for mild pain, use Norco for more severe pain. Norco may cause drowsiness, please use this medication with caution. It is important that she see a dentist as soon as possible.

## 2015-05-13 NOTE — ED Notes (Signed)
Pt has an artificial tooth to her front R tooth states it is 67 years old and yesterday noticed it swelling  Now swelling is into her upper lip with pain including her r nares and reports pain as throbbing in spite of tylenol, motrin as well as compresses

## 2015-05-13 NOTE — ED Notes (Signed)
Patient reports of swelling and pain to right upper lip since yesterday.

## 2015-05-13 NOTE — ED Provider Notes (Signed)
CSN: [id number]     Arrival date & time 05/13/15  1517 History  By signing my name below, I, Ronney Lion, attest that this documentation has been prepared under the direction and in the presence of Ivery Quale, PA-C. Electronically Signed: Ronney Lion, ED Scribe. 05/13/2015. 7:26 PM.    Chief Complaint  Patient presents with  . Abscess   The history is provided by the patient. No language interpreter was used.   HPI Comments: Sierra Greer is a 65 y.o. female with a history of GERD, diverticulitis, and leaky heart valve, who presents to the Emergency Department complaining of swelling to her right upper lip since yesterday, over an artificicial tooth. She notes associated throbbing pain to the area, radiating up to her bilateral ears. She states she has had an artificial tooth for years, "ever since I was young." Patient states she had tried home remedies, heat, sinus medication (as she thought she might have a sinus infection), and Tylenol; all with no relief. She denies any fever, nausea, or headaches.  Past Medical History  Diagnosis Date  . GERD (gastroesophageal reflux disease)   . Diverticulitis   . Leaky heart valve    Past Surgical History  Procedure Laterality Date  . Abdominal hysterectomy     Family History  Problem Relation Age of Onset  . Arthritis Mother   . COPD Father   . Mesothelioma Father   . Hepatitis C Sister    Social History  Substance Use Topics  . Smoking status: Former Smoker -- 0.50 packs/day    Types: Cigarettes    Start date: 02/09/1994    Quit date: 01/21/2002  . Smokeless tobacco: None  . Alcohol Use: No   OB History    No data available     Review of Systems  Constitutional: Negative for fever.  HENT: Positive for dental problem and facial swelling.   Gastrointestinal: Negative for nausea.  Neurological: Negative for headaches.   Allergies  Penicillins  Home Medications   Prior to Admission medications   Medication Sig Start Date  End Date Taking? Authorizing Provider  aspirin EC 81 MG tablet Take 162 mg by mouth daily.   Yes Historical Provider, MD  diphenhydrAMINE (BENADRYL) 25 MG tablet Take 50 mg by mouth every 6 (six) hours as needed (Swelling, breathing).   Yes Historical Provider, MD  omeprazole (PRILOSEC) 20 MG capsule Take 20 mg by mouth daily.   Yes Historical Provider, MD   BP 138/89 mmHg  Pulse 97  Temp(Src) 98.8 F (37.1 C) (Oral)  Resp 18  Ht  (1.6 m)  Wt 225 lb (102.059 kg)  BMI 39.87 kg/m2  SpO2 99% Physical Exam  Constitutional: She is oriented to person, place, and time. She appears well-developed and well-nourished. No distress.  HENT:  Head: Normocephalic and atraumatic.  There is swelling of the upper lip, extending into the nares. There is an implant tooth (the right front). There is swelling of the gum. The airway is patent. There are no lesions of the buccal mucosa or upper lip. Mild tenderness to percussion of the maxillary sinuses. There is swelling at the anterior septum, and tenderness of the nares.  Eyes: Conjunctivae and EOM are normal.  Neck: Neck supple. No tracheal deviation present.  There are no cervical or submental nodes appreciated.   Cardiovascular: Normal rate.   Pulmonary/Chest: Effort normal. No respiratory distress.  Musculoskeletal: Normal range of motion.  Lymphadenopathy:    She has no cervical adenopathy.  Neurological:  She is alert and oriented to person, place, and time.  Skin: Skin is warm and dry.  Psychiatric: She has a normal mood and affect. Her behavior is normal.  Nursing note and vitals reviewed.   ED Course  Case reviewed by Dr Clarene DukeMcManus  Procedures (including critical care time)  DIAGNOSTIC STUDIES: Oxygen Saturation is 99% on RA, normal by my interpretation.    COORDINATION OF CARE: 4:44 PM - Discussed treatment plan with pt at bedside which includes imaging. Pt verbalized understanding and agreed to plan.   5:08 PM - Pt's WBC is noted  to be 12,200. We will consider CT maxillofacial to rule out abscess vs. Cellulitis.   Labs Review Labs Reviewed  CBC WITH DIFFERENTIAL/PLATELET - Abnormal; Notable for the following:    WBC 12.2 (*)    RBC 5.61 (*)    Hemoglobin 16.0 (*)    HCT 48.3 (*)    Neutro Abs 8.0 (*)    All other components within normal limits  BASIC METABOLIC PANEL - Abnormal; Notable for the following:    Glucose, Bld 116 (*)    All other components within normal limits    Imaging Review Ct Maxillofacial W/cm  05/13/2015  CLINICAL DATA:  Right upper lip swelling extending to the nares. Abscess/cellulitis. Swelling increased today. Remote injury with artificial tooth. EXAM: CT MAXILLOFACIAL WITH CONTRAST TECHNIQUE: Multidetector CT imaging of the maxillofacial structures was performed with intravenous contrast. Multiplanar CT image reconstructions were also generated. A small metallic BB was placed on the right temple in order to reliably differentiate right from left. CONTRAST:  75mL OMNIPAQUE IOHEXOL 300 MG/ML  SOLN COMPARISON:  None. FINDINGS: Mild skin thickening and asymmetric edema about the inferior right nares extending to the upper lip. No fluid collection or abscess. There is an artificial right central incisor without periapical lucency. Additional dental hardware is seen. Probable dental carie involving the left upper first molar with adjacent periapical lucency. Small bilateral cervical nodes, likely reactive. Paranasal sinuses are clear. Mastoid air cells are well aerated. No abnormality in the included portion of the brain. IMPRESSION: 1. Soft tissue inflammation about the right upper lip and inferior nare. No abscess. Artificial right upper central incisor without complication. 2. Probable dental carie involving left upper first molar with periapical lucency. This may reflect a periapical abscess, recommend correlation for symptoms in this region. Electronically Signed   By: Rubye OaksMelanie  Ehinger M.D.   On:  05/13/2015 18:45   I have personally reviewed and evaluated these images and lab results as part of my medical decision-making.  MDM  BASIC metabolic panel is well within normal limits. Complete account reveals a white blood cell count of 12,200, otherwise the CBC is within normal limits. The CT scan of the maxillofacial bones and tissues reveals soft tissue inflammation in the right upper lip and inferior nares. There is a probable dental caries involving the left upper first molar with apical lucency also present.    Final diagnoses:  None    **I personally performed the services described in this documentation, which was scribed in my presence. The recorded information has been reviewed and is accurate.*  I have reviewed nursing notes, vital signs, and all appropriate lab and imaging results for this patient.  Ivery QualeHobson Alen Matheson, PA-C 05/13/15 2045  Samuel JesterKathleen McManus, DO 05/16/15 [phone]

## 2015-12-26 ENCOUNTER — Ambulatory Visit (INDEPENDENT_AMBULATORY_CARE_PROVIDER_SITE_OTHER): Payer: Medicare HMO | Admitting: Physician Assistant

## 2015-12-26 ENCOUNTER — Encounter: Payer: Self-pay | Admitting: Physician Assistant

## 2015-12-26 VITALS — BP 150/84 | HR 71 | Temp 97.2°F | Ht 63.0 in | Wt 257.0 lb

## 2015-12-26 DIAGNOSIS — N3001 Acute cystitis with hematuria: Secondary | ICD-10-CM

## 2015-12-26 DIAGNOSIS — R300 Dysuria: Secondary | ICD-10-CM | POA: Diagnosis not present

## 2015-12-26 LAB — URINALYSIS
BILIRUBIN UA: NEGATIVE
KETONES UA: NEGATIVE
NITRITE UA: POSITIVE — AB
SPEC GRAV UA: 1.02 (ref 1.005–1.030)
UUROB: 1 mg/dL (ref 0.2–1.0)
pH, UA: 5 (ref 5.0–7.5)

## 2015-12-26 MED ORDER — SULFAMETHOXAZOLE-TRIMETHOPRIM 800-160 MG PO TABS: 1.0000 | ORAL_TABLET | Freq: Two times a day (BID) | ORAL | 0 refills | Status: DC

## 2015-12-26 NOTE — Patient Instructions (Signed)

## 2015-12-26 NOTE — Progress Notes (Signed)
  Assessment & Plan:   1. Dysuria - Urine culture - Urinalysis  2. Acute cystitis with hematuria - sulfamethoxazole-trimethoprim (BACTRIM DS) 800-160 MG tablet; Take 1 tablet by mouth 2 (two) times daily.  Dispense: 14 tablet; Refill: 0  Continue all other maintenance medications as listed above.  Follow up plan: Return if symptoms worsen or fail to improve.  Orders Placed This Encounter  Procedures  . Urine culture  . Urinalysis    Educational handout given for UTI  Remus LofflerAngel S. Chynna Buerkle PA-C [facility] [address]  [place], [place] [id number] [phone]   12/26/2015, 5:29 PM

## 2015-12-28 LAB — URINE CULTURE

## 2018-03-30 ENCOUNTER — Ambulatory Visit (INDEPENDENT_AMBULATORY_CARE_PROVIDER_SITE_OTHER): Payer: Medicare HMO | Admitting: Family

## 2018-03-30 ENCOUNTER — Encounter: Payer: Self-pay | Admitting: Family

## 2018-03-30 VITALS — BP 114/65 | HR 84 | Temp 97.4°F | Ht 63.0 in | Wt 266.4 lb

## 2018-03-30 DIAGNOSIS — Z0001 Encounter for general adult medical examination with abnormal findings: Secondary | ICD-10-CM

## 2018-03-30 DIAGNOSIS — Z23 Encounter for immunization: Secondary | ICD-10-CM | POA: Diagnosis not present

## 2018-03-30 DIAGNOSIS — Z1212 Encounter for screening for malignant neoplasm of rectum: Secondary | ICD-10-CM | POA: Diagnosis not present

## 2018-03-30 DIAGNOSIS — Z1159 Encounter for screening for other viral diseases: Secondary | ICD-10-CM | POA: Diagnosis not present

## 2018-03-30 DIAGNOSIS — K219 Gastro-esophageal reflux disease without esophagitis: Secondary | ICD-10-CM | POA: Diagnosis not present

## 2018-03-30 DIAGNOSIS — Z1211 Encounter for screening for malignant neoplasm of colon: Secondary | ICD-10-CM

## 2018-03-30 DIAGNOSIS — E785 Hyperlipidemia, unspecified: Secondary | ICD-10-CM | POA: Diagnosis not present

## 2018-03-30 DIAGNOSIS — Z0000 Encounter for general adult medical examination without abnormal findings: Secondary | ICD-10-CM

## 2018-03-30 NOTE — Progress Notes (Signed)
Subjective:    Patient ID: Sierra Greer, female    DOB: September 29, 1950, 68 y.o.   MRN: [id number]  Chief Complaint  Patient presents with  . Annual Exam   Pt presents to the office today for for CPE.  Gastroesophageal Reflux  She complains of heartburn. She reports no belching. This is a chronic problem. The current episode started more than 1 year ago. The problem occurs occasionally. The problem has been waxing and waning. Risk factors include obesity. She has tried a PPI for the symptoms. The treatment provided moderate relief.  Hyperlipidemia  This is a chronic problem. The current episode started more than 1 year ago. The problem is uncontrolled. Recent lipid tests were reviewed and are high. Exacerbating diseases include obesity. Current antihyperlipidemic treatment includes diet change. The current treatment provides no improvement of lipids.  Arthritis  Presents for follow-up visit. She complains of pain and stiffness. The symptoms have been stable. Affected locations include the right knee and left knee. Her pain is at a severity of 9/10.      Review of Systems  Gastrointestinal: Positive for heartburn.  Musculoskeletal: Positive for arthralgias, arthritis and stiffness.  All other systems reviewed and are negative.  Family History  Problem Relation Age of Onset  . Arthritis Mother   . COPD Father   . Mesothelioma Father   . Hepatitis C Sister    Social History   Socioeconomic History  . Marital status: Divorced    Spouse name: Not on file  . Number of children: Not on file  . Years of education: Not on file  . Highest education level: Not on file  Occupational History  . Not on file  Social Needs  . Financial resource strain: Not on file  . Food insecurity:    Worry: Not on file    Inability: Not on file  . Transportation needs:    Medical: Not on file    Non-medical: Not on file  Tobacco Use  . Smoking status: Former Smoker    Packs/day: 0.50    Types:  Cigarettes    Start date: 02/09/1994    Last attempt to quit: 01/21/2002    Years since quitting: 16.1  . Smokeless tobacco: Never Used  Substance and Sexual Activity  . Alcohol use: No  . Drug use: No  . Sexual activity: Not on file  Lifestyle  . Physical activity:    Days per week: Not on file    Minutes per session: Not on file  . Stress: Not on file  Relationships  . Social connections:    Talks on phone: Not on file    Gets together: Not on file    Attends religious service: Not on file    Active member of club or organization: Not on file    Attends meetings of clubs or organizations: Not on file    Relationship status: Not on file  Other Topics Concern  . Not on file  Social History Narrative  . Not on file       Objective:   Physical Exam Vitals signs reviewed.  Constitutional:      General: She is not in acute distress.    Appearance: She is well-developed. She is obese.  HENT:     Head: Normocephalic and atraumatic.     Right Ear: External ear normal.  Eyes:     Pupils: Pupils are equal, round, and reactive to light.  Neck:     Musculoskeletal: Normal range  of motion and neck supple.     Thyroid: No thyromegaly.  Cardiovascular:     Rate and Rhythm: Normal rate and regular rhythm.     Heart sounds: Normal heart sounds. No murmur.  Pulmonary:     Effort: Pulmonary effort is normal. No respiratory distress.     Breath sounds: Normal breath sounds. No wheezing.  Abdominal:     General: Bowel sounds are normal. There is no distension.     Palpations: Abdomen is soft.     Tenderness: There is no abdominal tenderness.  Musculoskeletal: Normal range of motion.        General: No tenderness.     Right lower leg: Edema (trace ) present.     Left lower leg: Edema (trace) present.  Skin:    General: Skin is warm and dry.  Neurological:     Mental Status: She is alert and oriented to person, place, and time.     Cranial Nerves: No cranial nerve deficit.      Deep Tendon Reflexes: Reflexes are normal and symmetric.  Psychiatric:        Behavior: Behavior normal.        Thought Content: Thought content normal.        Judgment: Judgment normal.       BP 114/65   Pulse 84   Temp (!) 97.4 F (36.3 C) (Oral)   Ht '5\' 3"'  (1.6 m)   Wt 266 lb 6.4 oz (120.8 kg)   BMI 47.19 kg/m      Assessment & Plan:  Sierra Greer comes in today with chief complaint of Annual Exam   Diagnosis and orders addressed:  1. Annual physical exam - CMP14+EGFR - CBC with Differential/Platelet - Lipid panel - TSH - Hepatitis C antibody - Cologuard  2. Morbid obesity ([place]) - CMP14+EGFR - CBC with Differential/Platelet  3. Hyperlipidemia, unspecified hyperlipidemia type - CMP14+EGFR - CBC with Differential/Platelet - Lipid panel  4. Gastroesophageal reflux disease, esophagitis presence not specified - CMP14+EGFR - CBC with Differential/Platelet  5. Colon cancer screening - CMP14+EGFR - CBC with Differential/Platelet - Cologuard  6. Screening for malignant neoplasm of the rectum - CMP14+EGFR - CBC with Differential/Platelet - Cologuard  7. Need for hepatitis C screening test - CMP14+EGFR - CBC with Differential/Platelet - Hepatitis C antibody   Labs pending Health Maintenance reviewed Diet and exercise encouraged  Follow up plan: 1 year    Evelina Dun, FNP

## 2018-03-30 NOTE — Addendum Note (Signed)
Addended by: Almeta MonasSTONE, JANIE M on: 03/30/2018 09:37 AM   Modules accepted: Orders

## 2018-03-30 NOTE — Patient Instructions (Signed)
Health Maintenance After Age 70 After age 70, you are at a higher risk for certain long-term diseases and infections as well as injuries from falls. Falls are a major cause of broken bones and head injuries in people who are older than age 70. Getting regular preventive care can help to keep you healthy and well. Preventive care includes getting regular testing and making lifestyle changes as recommended by your health care provider. Talk with your health care provider about:  Which screenings and tests you should have. A screening is a test that checks for a disease when you have no symptoms.  A diet and exercise plan that is right for you. What should I know about screenings and tests to prevent falls? Screening and testing are the best ways to find a health problem early. Early diagnosis and treatment give you the best chance of managing medical conditions that are common after age 70. Certain conditions and lifestyle choices may make you more likely to have a fall. Your health care provider may recommend:  Regular vision checks. Poor vision and conditions such as cataracts can make you more likely to have a fall. If you wear glasses, make sure to get your prescription updated if your vision changes.  Medicine review. Work with your health care provider to regularly review all of the medicines you are taking, including over-the-counter medicines. Ask your health care provider about any side effects that may make you more likely to have a fall. Tell your health care provider if any medicines that you take make you feel dizzy or sleepy.  Osteoporosis screening. Osteoporosis is a condition that causes the bones to get weaker. This can make the bones weak and cause them to break more easily.  Blood pressure screening. Blood pressure changes and medicines to control blood pressure can make you feel dizzy.  Strength and balance checks. Your health care provider may recommend certain tests to check your  strength and balance while standing, walking, or changing positions.  Foot health exam. Foot pain and numbness, as well as not wearing proper footwear, can make you more likely to have a fall.  Depression screening. You may be more likely to have a fall if you have a fear of falling, feel emotionally low, or feel unable to do activities that you used to do.  Alcohol use screening. Using too much alcohol can affect your balance and may make you more likely to have a fall. What actions can I take to lower my risk of falls? General instructions  Talk with your health care provider about your risks for falling. Tell your health care provider if: ? You fall. Be sure to tell your health care provider about all falls, even ones that seem minor. ? You feel dizzy, sleepy, or off-balance.  Take over-the-counter and prescription medicines only as told by your health care provider. These include any supplements.  Eat a healthy diet and maintain a healthy weight. A healthy diet includes low-fat dairy products, low-fat (lean) meats, and fiber from whole grains, beans, and lots of fruits and vegetables. Home safety  Remove any tripping hazards, such as rugs, cords, and clutter.  Install safety equipment such as grab bars in bathrooms and safety rails on stairs.  Keep rooms and walkways well-lit. Activity   Follow a regular exercise program to stay fit. This will help you maintain your balance. Ask your health care provider what types of exercise are appropriate for you.  If you need a cane or   walker, use it as recommended by your health care provider.  Wear supportive shoes that have nonskid soles. Lifestyle  Do not drink alcohol if your health care provider tells you not to drink.  If you drink alcohol, limit how much you have: ? 0-1 drink a day for women. ? 0-2 drinks a day for men.  Be aware of how much alcohol is in your drink. In the [place] one drink equals one typical bottle of beer (12  oz), one-half glass of wine (5 oz), or one shot of hard liquor (1 oz).  Do not use any products that contain nicotine or tobacco, such as cigarettes and e-cigarettes. If you need help quitting, ask your health care provider. Summary  Having a healthy lifestyle and getting preventive care can help to protect your health and wellness after age 70.  Screening and testing are the best way to find a health problem early and help you avoid having a fall. Early diagnosis and treatment give you the best chance for managing medical conditions that are more common for people who are older than age 70.  Falls are a major cause of broken bones and head injuries in people who are older than age 70. Take precautions to prevent a fall at home.  Work with your health care provider to learn what changes you can make to improve your health and wellness and to prevent falls. This information is not intended to replace advice given to you by your health care provider. Make sure you discuss any questions you have with your health care provider. Document Released: 01/07/2017 Document Revised: 01/07/2017 Document Reviewed: 01/07/2017 Elsevier Interactive Patient Education  2019 [employer]  

## 2018-03-31 LAB — CBC WITH DIFFERENTIAL/PLATELET
BASOS ABS: 0 10*3/uL (ref 0.0–0.2)
Basos: 0 %
EOS (ABSOLUTE): 0.2 10*3/uL (ref 0.0–0.4)
EOS: 2 %
HEMOGLOBIN: 15.1 g/dL (ref 11.1–15.9)
Hematocrit: 44.1 % (ref 34.0–46.6)
IMMATURE GRANS (ABS): 0 10*3/uL (ref 0.0–0.1)
IMMATURE GRANULOCYTES: 0 %
LYMPHS ABS: 2.3 10*3/uL (ref 0.7–3.1)
LYMPHS: 28 %
MCH: 28.8 pg (ref 26.6–33.0)
MCHC: 34.2 g/dL (ref 31.5–35.7)
MCV: 84 fL (ref 79–97)
MONOCYTES: 7 %
Monocytes Absolute: 0.5 10*3/uL (ref 0.1–0.9)
NEUTROS PCT: 63 %
Neutrophils Absolute: 5.2 10*3/uL (ref 1.4–7.0)
Platelets: 231 10*3/uL (ref 150–450)
RBC: 5.25 x10E6/uL (ref 3.77–5.28)
RDW: 12.2 % (ref 11.7–15.4)
WBC: 8.3 10*3/uL (ref 3.4–10.8)

## 2018-03-31 LAB — CMP14+EGFR
ALK PHOS: 69 IU/L (ref 39–117)
ALT: 20 IU/L (ref 0–32)
AST: 15 IU/L (ref 0–40)
Albumin/Globulin Ratio: 1.5 (ref 1.2–2.2)
Albumin: 4.1 g/dL (ref 3.8–4.8)
BILIRUBIN TOTAL: 0.3 mg/dL (ref 0.0–1.2)
BUN/Creatinine Ratio: 19 (ref 12–28)
BUN: 15 mg/dL (ref 8–27)
CHLORIDE: 103 mmol/L (ref 96–106)
CO2: 23 mmol/L (ref 20–29)
CREATININE: 0.81 mg/dL (ref 0.57–1.00)
Calcium: 9.7 mg/dL (ref 8.7–10.3)
GFR calc Af Amer: 86 mL/min/{1.73_m2} (ref >59)
GFR calc non Af Amer: 74 mL/min/{1.73_m2} (ref >59)
Globulin, Total: 2.7 g/dL (ref 1.5–4.5)
Glucose: 120 mg/dL — ABNORMAL HIGH (ref 65–99)
Potassium: 4.3 mmol/L (ref 3.5–5.2)
Sodium: 142 mmol/L (ref 134–144)
Total Protein: 6.8 g/dL (ref 6.0–8.5)

## 2018-03-31 LAB — LIPID PANEL
CHOLESTEROL TOTAL: 227 mg/dL — AB (ref 100–199)
Chol/HDL Ratio: 4 ratio (ref 0.0–4.4)
HDL: 57 mg/dL (ref >39)
LDL CALC: 135 mg/dL — AB (ref 0–99)
TRIGLYCERIDES: 173 mg/dL — AB (ref 0–149)
VLDL Cholesterol Cal: 35 mg/dL (ref 5–40)

## 2018-03-31 LAB — HEPATITIS C ANTIBODY: Hep C Virus Ab: <0.1 s/co ratio (ref 0.0–0.9)

## 2018-03-31 LAB — TSH: TSH: 1.1 u[IU]/mL (ref 0.450–4.500)

## 2018-04-01 ENCOUNTER — Other Ambulatory Visit: Payer: Self-pay | Admitting: Family

## 2018-04-01 MED ORDER — ATORVASTATIN CALCIUM 20 MG PO TABS: 20.0000 mg | ORAL_TABLET | Freq: Every day | ORAL | 3 refills | Status: DC

## 2018-04-26 ENCOUNTER — Encounter: Payer: Medicare HMO | Admitting: Nurse Practitioner

## 2018-06-14 ENCOUNTER — Telehealth: Payer: Self-pay | Admitting: Family

## 2018-06-14 NOTE — Telephone Encounter (Signed)
[email or web address]

## 2018-06-14 NOTE — Telephone Encounter (Signed)
Apt made

## 2018-06-14 NOTE — Telephone Encounter (Signed)
Please make pt a televisit appt

## 2018-06-15 ENCOUNTER — Other Ambulatory Visit: Payer: Self-pay

## 2018-06-15 ENCOUNTER — Encounter: Payer: Self-pay | Admitting: Family

## 2018-06-15 ENCOUNTER — Ambulatory Visit (INDEPENDENT_AMBULATORY_CARE_PROVIDER_SITE_OTHER): Payer: Medicare HMO | Admitting: Family

## 2018-06-15 DIAGNOSIS — Z7189 Other specified counseling: Secondary | ICD-10-CM | POA: Diagnosis not present

## 2018-06-15 NOTE — Progress Notes (Signed)
   Virtual Visit via telephone Note  I connected with Bennett Scrape on 06/15/18 at 9:44 AM by telephone and verified that I am speaking with the correct person using two identifiers. Sierra Greer is currently located at home  and no one is currently with her during visit. The provider, Jannifer Rodney, FNP is located in their office at time of visit.  I discussed the limitations, risks, security and privacy concerns of performing an evaluation and management service by telephone and the availability of in person appointments. I also discussed with the patient that there may be a patient responsible charge related to this service. The patient expressed understanding and agreed to proceed.   History and Present Illness:   HPI Pt calls office today for a work note. 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 She is a morbid obese 68 years old, and is currently caring for 4 week twins.  She is worried about contracting COVID 19. She states they are not screening individuals and people with cough or fevers are allowed on the bus.   She states she would rather be taken out of work at this time not to risk her health or the twin babies she is caring for.    Review of Systems  All other systems reviewed and are negative.      Observations/Objective: No SOB or distress  Assessment and Plan: 1. Advice Given About Covid-19 Virus by Telephone  2. Morbid obesity [facility])  Given her age and caring for 2 week old twin babies, I will write patient a note out for work given current COVID19 pandemic. Continue to wash hands Stay at home as much as possible Call office if you need anything else      I discussed the assessment and treatment plan with the patient. The patient was provided an opportunity to ask questions and all were answered. The patient agreed with the plan and demonstrated an understanding of the instructions.   The patient was advised to  call back or seek an in-person evaluation if the symptoms worsen or if the condition fails to improve as anticipated.  The above assessment and management plan was discussed with the patient. The patient verbalized understanding of and has agreed to the management plan. Patient is aware to call the clinic if symptoms persist or worsen. Patient is aware when to return to the clinic for a follow-up visit. Patient educated on when it is appropriate to go to the emergency department.    Call ended 9:59AM, I provided 15 minutes of non-face-to-face time during this encounter.    Jannifer Rodney, FNP

## 2018-07-12 ENCOUNTER — Telehealth: Payer: Self-pay | Admitting: Family

## 2018-07-13 ENCOUNTER — Other Ambulatory Visit: Payer: Self-pay

## 2018-07-13 ENCOUNTER — Encounter: Payer: Self-pay | Admitting: Family

## 2018-07-13 ENCOUNTER — Ambulatory Visit (INDEPENDENT_AMBULATORY_CARE_PROVIDER_SITE_OTHER): Payer: Medicare HMO | Admitting: Family

## 2018-07-13 DIAGNOSIS — Z7189 Other specified counseling: Secondary | ICD-10-CM

## 2018-07-13 NOTE — Progress Notes (Signed)
   Virtual Visit via telephone Note  I connected with Sierra Greer on 07/13/18 at 11:25 AM by telephone and verified that I am speaking with the correct person using two identifiers. Ellianna Dudzinski is currently located at home and no one is currently with her during visit. The provider, Jannifer Rodney, FNP is located in their office at time of visit.  I discussed the limitations, risks, security and privacy concerns of performing an evaluation and management service by telephone and the availability of in person appointments. I also discussed with the patient that there may be a patient responsible charge related to this service. The patient expressed understanding and agreed to proceed.   History and Present Illness:  HPI PT calls today requesting a doctors note to be off from her work. 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   She completed a virtual visit on 06/15/18 and given a noted for one month and told to call office if she needed note extended. Since we are still having active cases of COVID-19 we will write another letter for patient.   She is still caring for her twin grandbabies that are 80 weeks old.      Review of Systems  All other systems reviewed and are negative.    Observations/Objective: No SOB or distress noted  Assessment and Plan: 1. 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 The patient was provided an opportunity to ask questions and all were answered. The patient agreed with the plan and demonstrated an understanding of the instructions.   The patient was advised to call back or seek an in-person evaluation if the symptoms worsen or if the condition fails to  improve as anticipated.  The above assessment and management plan was discussed with the patient. The patient verbalized understanding of and has agreed to the management plan. Patient is aware to call the clinic if symptoms persist or worsen. Patient is aware when to return to the clinic for a follow-up visit. Patient educated on when it is appropriate to go to the emergency department.   Time call ended:  11:35 AM  I provided 10 minutes of non-face-to-face time during this encounter.    Jannifer Rodney, FNP

## 2018-09-14 ENCOUNTER — Other Ambulatory Visit: Payer: Self-pay

## 2018-09-14 ENCOUNTER — Ambulatory Visit (INDEPENDENT_AMBULATORY_CARE_PROVIDER_SITE_OTHER): Payer: Medicare HMO | Admitting: *Deleted

## 2018-09-14 ENCOUNTER — Encounter: Payer: Self-pay | Admitting: *Deleted

## 2018-09-14 DIAGNOSIS — Z0000 Encounter for general adult medical examination without abnormal findings: Secondary | ICD-10-CM | POA: Diagnosis not present

## 2018-09-14 NOTE — Patient Instructions (Signed)
Preventive Care 38 Years and Older, Female Preventive care refers to lifestyle choices and visits with your health care provider that can promote health and wellness. This includes:  A yearly physical exam. This is also called an annual well check.  Regular dental and eye exams.  Immunizations.  Screening for certain conditions.  Healthy lifestyle choices, such as diet and exercise. What can I expect for my preventive care visit? Physical exam Your health care provider will check:  Height and weight. These may be used to calculate body mass index (BMI), which is a measurement that tells if you are at a healthy weight.  Heart rate and blood pressure.  Your skin for abnormal spots. Counseling Your health care provider may ask you questions about:  Alcohol, tobacco, and drug use.  Emotional well-being.  Home and relationship well-being.  Sexual activity.  Eating habits.  History of falls.  Memory and ability to understand (cognition).  Work and work Statistician.  Pregnancy and menstrual history. What immunizations do I need?  Influenza (flu) vaccine  This is recommended every year. Tetanus, diphtheria, and pertussis (Tdap) vaccine  You may need a Td booster every 10 years. Varicella (chickenpox) vaccine  You may need this vaccine if you have not already been vaccinated. Zoster (shingles) vaccine  You may need this after age 70. Pneumococcal conjugate (PCV13) vaccine  One dose is recommended after age 70. Pneumococcal polysaccharide (PPSV23) vaccine  One dose is recommended after age 70. Measles, mumps, and rubella (MMR) vaccine  You may need at least one dose of MMR if you were born in 1957 or later. You may also need a second dose. Meningococcal conjugate (MenACWY) vaccine  You may need this if you have certain conditions. Hepatitis A vaccine  You may need this if you have certain conditions or if you travel or work in places where you may be exposed  to hepatitis A. Hepatitis B vaccine  You may need this if you have certain conditions or if you travel or work in places where you may be exposed to hepatitis B. Haemophilus influenzae type b (Hib) vaccine  You may need this if you have certain conditions. You may receive vaccines as individual doses or as more than one vaccine together in one shot (combination vaccines). Talk with your health care provider about the risks and benefits of combination vaccines. What tests do I need? Blood tests  Lipid and cholesterol levels. These may be checked every 5 years, or more frequently depending on your overall health.  Hepatitis C test.  Hepatitis B test. Screening  Lung cancer screening. You may have this screening every year starting at age 39 if you have a 30-pack-year history of smoking and currently smoke or have quit within the past 15 years.  Colorectal cancer screening. All adults should have this screening starting at age 36 and continuing until age 15. Your health care provider may recommend screening at age 23 if you are at increased risk. You will have tests every 1-10 years, depending on your results and the type of screening test.  Diabetes screening. This is done by checking your blood sugar (glucose) after you have not eaten for a while (fasting). You may have this done every 1-3 years.  Mammogram. This may be done every 1-2 years. Talk with your health care provider about how often you should have regular mammograms.  BRCA-related cancer screening. This may be done if you have a family history of breast, ovarian, tubal, or peritoneal cancers.  Other tests  Sexually transmitted disease (STD) testing.  Bone density scan. This is done to screen for osteoporosis. You may have this done starting at age 55. Follow these instructions at home: Eating and drinking  Eat a diet that includes fresh fruits and vegetables, whole grains, lean protein, and low-fat dairy products. Limit  your intake of foods with high amounts of sugar, saturated fats, and salt.  Take vitamin and mineral supplements as recommended by your health care provider.  Do not drink alcohol if your health care provider tells you not to drink.  If you drink alcohol: ? Limit how much you have to 0-1 drink a day. ? Be aware of how much alcohol is in your drink. In the [place] one drink equals one 12 oz bottle of beer (355 mL), one 5 oz glass of wine (148 mL), or one 1 oz glass of hard liquor (44 mL). Lifestyle  Take daily care of your teeth and gums.  Stay active. Exercise for at least 30 minutes on 5 or more days each week.  Do not use any products that contain nicotine or tobacco, such as cigarettes, e-cigarettes, and chewing tobacco. If you need help quitting, ask your health care provider.  If you are sexually active, practice safe sex. Use a condom or other form of protection in order to prevent STIs (sexually transmitted infections).  Talk with your health care provider about taking a low-dose aspirin or statin. What's next?  Go to your health care provider once a year for a well check visit.  Ask your health care provider how often you should have your eyes and teeth checked.  Stay up to date on all vaccines. This information is not intended to replace advice given to you by your health care provider. Make sure you discuss any questions you have with your health care provider. Document Released: 03/23/2015 Document Revised: 02/18/2018 Document Reviewed: 02/18/2018 Elsevier Patient Education  2020 [employer].

## 2018-09-14 NOTE — Progress Notes (Addendum)
MEDICARE ANNUAL WELLNESS VISIT  09/14/2018  Telephone Visit Disclaimer This Medicare AWV was conducted by telephone due to national recommendations for restrictions regarding the COVID-19 Pandemic (e.g. social distancing).  I verified, using two identifiers, that I am speaking with Sierra ScrapeLinda Ryant or their authorized healthcare agent. I discussed the limitations, risks, security, and privacy concerns of performing an evaluation and management service by telephone and the potential availability of an in-person appointment in the future. The patient expressed understanding and agreed to proceed.   Subjective:  Sierra Greer is a 68 y.o. female patient of Hawks, Edilia Bohristy A, FNP who had a Medicare Annual Wellness Visit today via telephone. Sierra Greer is Working full time and lives alone. she has 4 children. she reports that she is socially active and does interact with friends/family regularly. she is minimally physically active mostly at work and enjoys spending time with her family, reading and browsing social media.  Patient Care Team: Junie SpencerHawks, Christy A, FNP as PCP - General (Family Medicine)  Advanced Directives 09/14/2018 05/13/2015  Does Patient Have a Medical Advance Directive? No No  Would patient like information on creating a medical advance directive? No - Patient declined No - patient declined information    Hospital Utilization Over the Past 12 Months: # of hospitalizations or ER visits: 0 # of surgeries: 0  Review of Systems    Patient reports that her overall health is unchanged compared to last year.  Patient Reported Readings (BP, Pulse, CBG, Weight, etc) none  Review of Systems: No complaints  All other systems negative.  Pain Assessment Pain : No/denies pain     Current Medications & Allergies (verified) Allergies as of 09/14/2018      Reactions   Penicillins Other (See Comments)   Has patient had a PCN reaction causing immediate rash, facial/tongue/throat swelling,  SOB or lightheadedness with hypotension: No Has patient had a PCN reaction causing severe rash involving mucus membranes or skin necrosis: No Has patient had a PCN reaction that required hospitalization No Has patient had a PCN reaction occurring within the last 10 years: No If all of the above answers are "NO", then may proceed with Cephalosporin use. Childhood Reaction; Developed an abscess at injection site      Medication List       Accurate as of September 14, 2018 10:22 AM. If you have any questions, ask your nurse or doctor.        STOP taking these medications   aspirin EC 81 MG tablet Stopped by: WYATT, AMY M, RN     TAKE these medications   atorvastatin 20 MG tablet Commonly known as: LIPITOR Take 1 tablet (20 mg total) by mouth daily.   diphenhydrAMINE 25 MG tablet Commonly known as: BENADRYL Take 50 mg by mouth every 6 (six) hours as needed (Swelling, breathing).   omeprazole 20 MG capsule Commonly known as: PRILOSEC Take 20 mg by mouth daily.       History (reviewed): Past Medical History:  Diagnosis Date  . Diverticulitis   . GERD (gastroesophageal reflux disease)   . Hyperlipidemia   . Leaky heart valve    Past Surgical History:  Procedure Laterality Date  . ABDOMINAL HYSTERECTOMY     Family History  Problem Relation Age of Onset  . Arthritis Mother   . COPD Father   . Mesothelioma Father   . Hepatitis C Sister    Social History   Socioeconomic History  . Marital status: Divorced  Spouse name: Not on file  . Number of children: 4  . Years of education: 13.5  . Highest education level: Some college, no degree  Occupational History  . Occupation: driver  Social Needs  . Financial resource strain: Not hard at all  . Food insecurity    Worry: Never true    Inability: Never true  . Transportation needs    Medical: No    Non-medical: No  Tobacco Use  . Smoking status: Former Smoker    Packs/day: 0.50    Types: Cigarettes    Start date:  02/09/1994    Quit date: 01/21/2002    Years since quitting: 16.6  . Smokeless tobacco: Never Used  Substance and Sexual Activity  . Alcohol use: No  . Drug use: No  . Sexual activity: Not Currently    Birth control/protection: Surgical  Lifestyle  . Physical activity    Days per week: 0 days    Minutes per session: 0 min  . Stress: Not at all  Relationships  . Social connections    Talks on phone: More than three times a week    Gets together: More than three times a week    Attends religious service: Never    Active member of club or organization: No    Attends meetings of clubs or organizations: Never    Relationship status: Divorced  Other Topics Concern  . Not on file  Social History Narrative  . Not on file    Activities of Daily Living In your present state of health, do you have any difficulty performing the following activities: 09/14/2018  Hearing? Y  Comment pt feels like her hearing isn't as good now as it was but hasn't been evaluated  Vision? N  Difficulty concentrating or making decisions? N  Walking or climbing stairs? Y  Comment due to what she thinks is arthritis in both knees  Dressing or bathing? N  Doing errands, shopping? N  Preparing Food and eating ? N  Using the Toilet? N  In the past six months, have you accidently leaked urine? N  Do you have problems with loss of bowel control? N  Managing your Medications? N  Managing your Finances? N  Housekeeping or managing your Housekeeping? N  Some recent data might be hidden    Patient Literacy How often do you need to have someone help you when you read instructions, pamphlets, or other written materials from your doctor or pharmacy?: 1 - Never What is the last grade level you completed in school?: 12th grade-some college  Exercise Current Exercise Habits: The patient has a physically strenuous job, but has no regular exercise apart from work., Exercise limited by: Other - see comments(obesity)   Diet Patient reports consuming 2 meals a day and 1 snack(s) a day Patient reports that her primary diet is: Regular Patient reports that she does have regular access to food.   Depression Screen PHQ 2/9 Scores 09/14/2018 12/26/2015  PHQ - 2 Score 0 1     Fall Risk Fall Risk  09/14/2018 12/26/2015  Falls in the past year? 0 No     Objective:  Sierra Greer seemed alert and oriented and she participated appropriately during our telephone visit.  Blood Pressure Weight BMI  BP Readings from Last 3 Encounters:  03/30/18 114/65  12/26/15 (!) 150/84  05/13/15 (!) 161/105   Wt Readings from Last 3 Encounters:  03/30/18 266 lb 6.4 oz (120.8 kg)  12/26/15 257 lb (116.6  kg)  05/13/15 225 lb (102.1 kg)   BMI Readings from Last 1 Encounters:  03/30/18 47.19 kg/m    *Unable to obtain current vital signs, weight, and BMI due to telephone visit type  Hearing/Vision  . Sierra Greer did not seem to have difficulty with hearing/understanding during the telephone conversation . Reports that she has not had a formal eye exam by an eye care professional within the past year . Reports that she has not had a formal hearing evaluation within the past year *Unable to fully assess hearing and vision during telephone visit type  Cognitive Function: 6CIT Screen 09/14/2018  What Year? 0 points  What month? 0 points  What time? 0 points  Count back from 20 0 points  Months in reverse 0 points  Repeat phrase 0 points  Total Score 0   (Normal:0-7, Significant for Dysfunction: >8)  Normal Cognitive Function Screening: Yes   Immunization & Health Maintenance Record Immunization History  Administered Date(s) Administered  . Pneumococcal Polysaccharide-23 03/30/2018    Health Maintenance  Topic Date Due  . Janet BerlinETANUS/TDAP  02/10/1968  . MAMMOGRAM  02/10/1999  . COLONOSCOPY  02/10/1999  . DEXA SCAN  02/09/2014  . INFLUENZA VACCINE  10/09/2018  . PNA vac Low Risk Adult (2 of 2 - PCV13) 03/31/2019  .  Hepatitis C Screening  Completed       Assessment  This is a routine wellness examination for Sierra Greer.  Health Maintenance: Due or Overdue Health Maintenance Due  Topic Date Due  . TETANUS/TDAP  02/10/1968  . MAMMOGRAM  02/10/1999  . COLONOSCOPY  02/10/1999  . DEXA SCAN  02/09/2014    Sierra Greer does not need a referral for Community Assistance: Care Management:   no Social Work:    no Prescription Assistance:  no Nutrition/Diabetes Education:  no   Plan:  Personalized Goals Goals Addressed            This Visit's Progress   . DIET - INCREASE WATER INTAKE       Try to drink 6-8 glasses of water daily.      Personalized Health Maintenance & Screening Recommendations  pt declines all health maintenance and screening recommendations including TDAP, Mammogram, Colonoscopy and DEXA   Lung Cancer Screening Recommended: no (Low Dose CT Chest recommended if Age 25-80 years, 30 pack-year currently smoking OR have quit w/in past 15 years) Hepatitis C Screening recommended: no HIV Screening recommended: no  Advanced Directives: Written information was not prepared per patient's request.  Referrals & Orders No orders of the defined types were placed in this encounter.   Follow-up Plan . Follow-up with Junie SpencerHawks, Christy A, FNP as planned   I have personally reviewed and noted the following in the patient's chart:   . Medical and social history . Use of alcohol, tobacco or illicit drugs  . Current medications and supplements . Functional ability and status . Nutritional status . Physical activity . Advanced directives . List of other physicians . Hospitalizations, surgeries, and ER visits in previous 12 months . Vitals . Screenings to include cognitive, depression, and falls . Referrals and appointments  In addition, I have reviewed and discussed with Sierra Greer certain preventive protocols, quality metrics, and best practice recommendations. A written  personalized care plan for preventive services as well as general preventive health recommendations is available and can be mailed to the patient at her request.      Margurite AuerbachCompton,  G, LPN  1/6/10967/09/2018    I have  reviewed and agree with the above AWV documentation.   Evelina Dun, FNP

## 2019-06-02 ENCOUNTER — Ambulatory Visit: Payer: Medicare HMO | Attending: Internal Medicine

## 2019-06-02 DIAGNOSIS — Z23 Encounter for immunization: Secondary | ICD-10-CM

## 2019-06-02 NOTE — Progress Notes (Signed)
   Covid-19 Vaccination Clinic  Name:  Sierra Greer    MRN: [id number] DOB: 07-11-1950  06/02/2019  Ms. Sierra Greer was observed post Covid-19 immunization for 15 minutes without incident. She was provided with Vaccine Information Sheet and instruction to access the V-Safe system.   Sierra Greer was instructed to call 911 with any severe reactions post vaccine: [name] Difficulty breathing  . Swelling of face and throat  . A fast heartbeat  . A bad rash all over body  . Dizziness and weakness   Immunizations Administered    Name Date Dose VIS Date Route   Moderna COVID-19 Vaccine 06/02/2019  9:34 AM 0.5 mL 02/08/2019 Intramuscular   Manufacturer: Moderna   Lot: [id number]   NDC: 32023-343-56

## 2019-07-05 ENCOUNTER — Ambulatory Visit: Payer: Medicare HMO | Attending: Internal Medicine

## 2019-07-05 DIAGNOSIS — Z23 Encounter for immunization: Secondary | ICD-10-CM

## 2019-07-05 NOTE — Progress Notes (Signed)
   Covid-19 Vaccination Clinic  Name:  Sierra Greer    MRN: [id number] DOB: Jun 25, 1950  07/05/2019  [name] was observed post Covid-19 immunization for 15 minutes without incident. She was provided with Vaccine Information Sheet and instruction to access the V-Safe system.   [name] was instructed to call 911 with any severe reactions post vaccine: [name] Difficulty breathing  . Swelling of face and throat  . A fast heartbeat  . A bad rash all over body  . Dizziness and weakness   Immunizations Administered    Name Date Dose VIS Date Route   Moderna COVID-19 Vaccine 07/05/2019  9:04 AM 0.5 mL 02/2019 Intramuscular   Manufacturer: Moderna   Lot: [id number]   NDC: 69437-005-25

## 2020-04-30 ENCOUNTER — Emergency Department (HOSPITAL_COMMUNITY): Payer: Medicare HMO

## 2020-04-30 ENCOUNTER — Emergency Department (HOSPITAL_COMMUNITY)
Admission: EM | Admit: 2020-04-30 | Discharge: 2020-04-30 | Disposition: A | Discharge status: Home / Self Care | Payer: Medicare HMO | Attending: Emergency Medicine | Admitting: Emergency Medicine

## 2020-04-30 ENCOUNTER — Encounter (HOSPITAL_COMMUNITY): Payer: Self-pay

## 2020-04-30 ENCOUNTER — Other Ambulatory Visit: Payer: Self-pay

## 2020-04-30 DIAGNOSIS — S161XXA Strain of muscle, fascia and tendon at neck level, initial encounter: Secondary | ICD-10-CM | POA: Insufficient documentation

## 2020-04-30 DIAGNOSIS — M25511 Pain in right shoulder: Secondary | ICD-10-CM | POA: Diagnosis not present

## 2020-04-30 DIAGNOSIS — Z87891 Personal history of nicotine dependence: Secondary | ICD-10-CM | POA: Diagnosis not present

## 2020-04-30 DIAGNOSIS — M542 Cervicalgia: Secondary | ICD-10-CM | POA: Diagnosis not present

## 2020-04-30 DIAGNOSIS — X58XXXA Exposure to other specified factors, initial encounter: Secondary | ICD-10-CM | POA: Insufficient documentation

## 2020-04-30 DIAGNOSIS — S199XXA Unspecified injury of neck, initial encounter: Secondary | ICD-10-CM | POA: Diagnosis present

## 2020-04-30 DIAGNOSIS — R079 Chest pain, unspecified: Secondary | ICD-10-CM | POA: Diagnosis not present

## 2020-04-30 DIAGNOSIS — R0789 Other chest pain: Secondary | ICD-10-CM | POA: Diagnosis not present

## 2020-04-30 MED ORDER — METHOCARBAMOL 500 MG PO TABS
500.0000 mg | ORAL_TABLET | Freq: Once | ORAL | Status: AC
  Administered 2020-04-30: 500 mg via ORAL
  Filled 2020-04-30: qty 1

## 2020-04-30 MED ORDER — KETOROLAC TROMETHAMINE 30 MG/ML IJ SOLN
30.0000 mg | Freq: Once | INTRAMUSCULAR | Status: AC
  Administered 2020-04-30: 30 mg via INTRAMUSCULAR
  Filled 2020-04-30: qty 1

## 2020-04-30 MED ORDER — METHOCARBAMOL 500 MG PO TABS: 500.0000 mg | ORAL_TABLET | Freq: Three times a day (TID) | ORAL | 0 refills | Status: DC | PRN

## 2020-04-30 MED ORDER — DICLOFENAC SODIUM 1 % EX GEL: 2.0000 g | Freq: Four times a day (QID) | CUTANEOUS | 0 refills | Status: DC | PRN

## 2020-04-30 NOTE — ED Provider Notes (Signed)
Emergency Department Provider Note   I have reviewed the triage vital signs and the nursing notes.   HISTORY  Chief Complaint Shoulder Pain   HPI Sierra Greer is a 69 y.o. female with past medical history reviewed below presents to the emergency department with pain in the right posterior shoulder.  Symptoms are recurrent.  Patient notes history of pain in this location in the past which is improved with massage and several days of over-the-counter medications.  She has been having symptoms for the past 4 days and trying her typical treatments with no relief in symptoms.  She has pain made worse with movement of the shoulder or touching a specific area along her shoulder blade.  She does have some mild discomfort in her neck but nothing severe.  No numbness into the arm but feels that the pain in the shoulder is limiting her use of the right arm 2/2 pain. She denies HA. No weakness in the LEs. No fever or chills. No SOB or CP.   Past Medical History:  Diagnosis Date  . Diverticulitis   . GERD (gastroesophageal reflux disease)   . Hyperlipidemia   . Leaky heart valve     Patient Active Problem List   Diagnosis Date Noted  . Palpitations 03/16/2013  . Hyperlipidemia 03/16/2013  . Morbid obesity (HCC) 03/16/2013  . H/O diverticulitis of colon 01/21/2013    Past Surgical History:  Procedure Laterality Date  . ABDOMINAL HYSTERECTOMY      Allergies Penicillins  Family History  Problem Relation Age of Onset  . Arthritis Mother   . COPD Father   . Mesothelioma Father   . Hepatitis C Sister     Social History Social History   Tobacco Use  . Smoking status: Former Smoker    Packs/day: 0.50    Types: Cigarettes    Start date: 02/09/1994    Quit date: 01/21/2002    Years since quitting: 18.2  . Smokeless tobacco: Never Used  Vaping Use  . Vaping Use: Never used  Substance Use Topics  . Alcohol use: No  . Drug use: No    Review of Systems  Constitutional: No  fever/chills Eyes: No visual changes. ENT: No sore throat. Cardiovascular: Denies chest pain. Respiratory: Denies shortness of breath. Gastrointestinal: No abdominal pain.  No nausea, no vomiting.  No diarrhea.  No constipation. Genitourinary: Negative for dysuria. Musculoskeletal: Negative for back pain. Positive right shoulder pain.  Skin: Negative for rash. Neurological: Negative for headaches, focal weakness or numbness.  10-point ROS otherwise negative.  ____________________________________________   PHYSICAL EXAM:  VITAL SIGNS: ED Triage Vitals  Enc Vitals Group     BP 04/30/20 0754 (!) 160/84     Pulse Rate 04/30/20 0754 73     Resp 04/30/20 0754 14     Temp 04/30/20 0754 (!) 97.5 F (36.4 C)     Temp Source 04/30/20 0754 Oral     SpO2 04/30/20 0754 99 %     Weight 04/30/20 0752 225 lb (102.1 kg)     Height 04/30/20 0752 5\' 4"  (1.626 m)   Constitutional: Alert and oriented. Well appearing and in no acute distress. Eyes: Conjunctivae are normal.  Head: Atraumatic. Nose: No congestion/rhinnorhea. Mouth/Throat: Mucous membranes are moist.  Neck: No stridor. Mild paracervical spine tenderness.  Cardiovascular: Normal rate, regular rhythm. Good peripheral circulation. Grossly normal heart sounds.   Respiratory: Normal respiratory effort.  No retractions. Lungs CTAB. Gastrointestinal: Soft and nontender. No distention.  Musculoskeletal: Abduction  of the right shoulder limited to approximately 90 degrees with some discomfort with moving beyond this.  Patient is focally tender at the medial, inferior aspect of the shoulder blade.  Pain is easily reproducible both with touching the area and with range of motion of the shoulder.  The joint is not hot or erythematous.  No tenderness along the elbow, forearm but does have some mild tenderness over the wrist.  Neurologic:  Normal speech and language. No gross focal neurologic deficits are appreciated.  Skin:  Skin is warm, dry and  intact. No rash noted.  ____________________________________________  RADIOLOGY  DG Chest 2 View  Result Date: 04/30/2020 CLINICAL DATA:  Right-sided chest pain. EXAM: CHEST - 2 VIEW COMPARISON:  January 16, 2013. FINDINGS: The heart size and mediastinal contours are within normal limits. Both lungs are clear. No pneumothorax or pleural effusion is noted. The visualized skeletal structures are unremarkable. IMPRESSION: No active cardiopulmonary disease. Electronically Signed   By: Lupita Raider M.D.   On: 04/30/2020 08:45   DG Shoulder Right  Result Date: 04/30/2020 CLINICAL DATA:  Acute right shoulder pain without known injury. EXAM: RIGHT SHOULDER - 2+ VIEW COMPARISON:  None. FINDINGS: There is no evidence of fracture or dislocation. Moderate degenerative changes seen involving the right acromioclavicular joint. Soft tissues are unremarkable. IMPRESSION: Moderate degenerative joint disease of the right acromioclavicular joint. No acute abnormality seen in the right shoulder. Electronically Signed   By: Lupita Raider M.D.   On: 04/30/2020 08:54   CT Cervical Spine Wo Contrast  Result Date: 04/30/2020 CLINICAL DATA:  Neck pain without injury. EXAM: CT CERVICAL SPINE WITHOUT CONTRAST TECHNIQUE: Multidetector CT imaging of the cervical spine was performed without intravenous contrast. Multiplanar CT image reconstructions were also generated. COMPARISON:  None. FINDINGS: Alignment: Minimal grade 1 anterolisthesis of C3-4 and C4-5 is noted secondary to posterior facet joint hypertrophy. Skull base and vertebrae: No acute fracture. No primary bone lesion or focal pathologic process. Soft tissues and spinal canal: No prevertebral fluid or swelling. No visible canal hematoma. Disc levels: Severe degenerative disc disease is noted at C5-6 and C6-7 with anterior posterior osteophyte formation. Upper chest: Negative. Other: Degenerative changes are seen involving posterior facet joints bilaterally.  IMPRESSION: Severe multilevel degenerative disc disease. No acute abnormality seen in the cervical spine. Electronically Signed   By: Lupita Raider M.D.   On: 04/30/2020 09:01    ____________________________________________   PROCEDURES  Procedure(s) performed:   Procedures  None  ____________________________________________   INITIAL IMPRESSION / ASSESSMENT AND PLAN / ED COURSE  Pertinent labs & imaging results that were available during my care of the patient were reviewed by me and considered in my medical decision making (see chart for details).   Patient presents to the emergency department with right shoulder pain.  Her exam is very consistent with musculoskeletal cause for pain.  Considered atypical ACS/PE but given exam and reproducible pain I feel this is exceedingly low probability.  Patient's exam is not consistent with septic joint.  She has tightness and focal tenderness of the shoulder blade with limitation in range of motion, possibly from rotator cuff injury.  Plan for imaging of the right shoulder and chest along with CT imaging of the cervical spine with some feeling of limitation of use of the right arm although suspect this is related to pain in the shoulder rather than cervical compression/stenosis.   CT imaging and plain films reviewed.  Patient with moderate degenerative changes in  the shoulder with more severe changes in the cervical spine.  Her exam is not consistent with acute cord compression prompting emergent MRI.  Plan for symptom management and will refer to local orthopedics for evaluation of the shoulder discomfort and possible rotator cuff injury.  Discussed management of symptoms at home along with shoulder range of motion exercises.  Discussed ED return precautions.  ____________________________________________  FINAL CLINICAL IMPRESSION(S) / ED DIAGNOSES  Final diagnoses:  Acute pain of right shoulder  Strain of neck muscle, initial encounter      MEDICATIONS GIVEN DURING THIS VISIT:  Medications  ketorolac (TORADOL) 30 MG/ML injection 30 mg (30 mg Intramuscular Given 04/30/20 0922)  methocarbamol (ROBAXIN) tablet 500 mg (500 mg Oral Given 04/30/20 1601)     NEW OUTPATIENT MEDICATIONS STARTED DURING THIS VISIT:  Discharge Medication List as of 04/30/2020  9:13 AM    START taking these medications   Details  diclofenac Sodium (VOLTAREN) 1 % GEL Apply 2 g topically 4 (four) times daily as needed., Starting Mon 04/30/2020, Normal    methocarbamol (ROBAXIN) 500 MG tablet Take 1 tablet (500 mg total) by mouth every 8 (eight) hours as needed for muscle spasms., Starting Mon 04/30/2020, Normal        Note:  This document was prepared using Dragon voice recognition software and may include unintentional dictation errors.  Alona Bene, MD, [facility]    Tayna [place], Arlyss Repress, MD 04/30/20 1022

## 2020-04-30 NOTE — ED Triage Notes (Signed)
Pt presents to ED with complaints of right shoulder pain since Friday. Pt states it is hurting on the back of her shoulder and right arm.

## 2020-04-30 NOTE — Discharge Instructions (Signed)

## 2020-05-01 ENCOUNTER — Telehealth: Payer: Self-pay | Admitting: Family

## 2020-05-03 ENCOUNTER — Ambulatory Visit (INDEPENDENT_AMBULATORY_CARE_PROVIDER_SITE_OTHER): Payer: Medicare HMO | Admitting: Family

## 2020-05-03 ENCOUNTER — Encounter: Payer: Self-pay | Admitting: Family

## 2020-05-03 ENCOUNTER — Other Ambulatory Visit: Payer: Self-pay

## 2020-05-03 VITALS — BP 172/76 | HR 68 | Temp 98.1°F | Ht 64.0 in | Wt 238.4 lb

## 2020-05-03 DIAGNOSIS — Z23 Encounter for immunization: Secondary | ICD-10-CM | POA: Diagnosis not present

## 2020-05-03 DIAGNOSIS — M5412 Radiculopathy, cervical region: Secondary | ICD-10-CM

## 2020-05-03 DIAGNOSIS — R030 Elevated blood-pressure reading, without diagnosis of hypertension: Secondary | ICD-10-CM | POA: Diagnosis not present

## 2020-05-03 DIAGNOSIS — M25511 Pain in right shoulder: Secondary | ICD-10-CM

## 2020-05-03 MED ORDER — DICLOFENAC SODIUM 75 MG PO TBEC: 75.0000 mg | DELAYED_RELEASE_TABLET | Freq: Two times a day (BID) | ORAL | 0 refills | Status: DC

## 2020-05-03 MED ORDER — PREDNISONE 10 MG (21) PO TBPK: ORAL_TABLET | ORAL | 0 refills | Status: DC

## 2020-05-03 NOTE — Progress Notes (Signed)
Subjective:    Patient ID: Sierra Greer, female    DOB: [birth year], 70 y.o.   MRN: [id number]  Chief Complaint  Patient presents with  . pinched nerve   PT presents to the office today with right shoulder pain that started a little over a week ago. She went to the ED on 04/30/20 and had a CT cervical that showed severe multilevel degenerative disc disease.  Back Pain This is a recurrent problem. The problem occurs intermittently. The problem has been gradually improving since onset. The pain is present in the thoracic spine. The quality of the pain is described as burning. Radiates to: right arm.  Shoulder Pain  The pain is present in the right shoulder. This is a new problem. The current episode started 1 to 4 weeks ago. There has been a history of trauma (using cane to walk). The problem occurs constantly. The quality of the pain is described as aching. The pain is at a severity of 7/10. The pain is moderate.      Review of Systems  Musculoskeletal: Positive for back pain.  All other systems reviewed and are negative.      Objective:   Physical Exam Vitals reviewed.  Constitutional:      General: She is not in acute distress.    Appearance: She is well-developed and well-nourished. She is obese.  HENT:     Head: Normocephalic and atraumatic.     Mouth/Throat:     Mouth: Oropharynx is clear and moist.  Eyes:     Pupils: Pupils are equal, round, and reactive to light.  Neck:     Thyroid: No thyromegaly.  Cardiovascular:     Rate and Rhythm: Normal rate and regular rhythm.     Pulses: Intact distal pulses.     Heart sounds: Normal heart sounds. No murmur heard.   Pulmonary:     Effort: Pulmonary effort is normal. No respiratory distress.     Breath sounds: Normal breath sounds. No wheezing.  Abdominal:     General: Bowel sounds are normal. There is no distension.     Palpations: Abdomen is soft.     Tenderness: There is no abdominal tenderness.  Musculoskeletal:         General: No tenderness or edema. Normal range of motion.     Cervical back: Normal range of motion and neck supple.     Comments: Full ROM of right shoulder, pain abduction   Skin:    General: Skin is warm and dry.  Neurological:     Mental Status: She is alert and oriented to person, place, and time.     Cranial Nerves: No cranial nerve deficit.     Deep Tendon Reflexes: Reflexes are normal and symmetric.  Psychiatric:        Mood and Affect: Mood and affect normal.        Behavior: Behavior normal.        Thought Content: Thought content normal.        Judgment: Judgment normal.       BP (!) 172/76   Pulse 68   Temp 98.1 F (36.7 C) (Temporal)   Ht '5\' 4"'  (1.626 m)   Wt 238 lb 6.4 oz (108.1 kg)   SpO2 99%   BMI 40.92 kg/m      Assessment & Plan:  Sierra Greer comes in today with chief complaint of pinched nerve   Diagnosis and orders addressed:  1. Need for immunization against influenza -  Flu Vaccine QUAD High Dose(Fluad) - CMP14+EGFR  2. Morbid obesity ([place]) - CMP14+EGFR  3. Acute pain of right shoulder Rest Encouraged to used rolling walker instead of cane No other NSAID's while taking diclofenac  Will follow up with chiropractor  - CMP14+EGFR - predniSONE (STERAPRED UNI-PAK 21 TAB) 10 MG (21) TBPK tablet; Use as directed  Dispense: 21 tablet; Refill: 0 - diclofenac (VOLTAREN) 75 MG EC tablet; Take 1 tablet (75 mg total) by mouth 2 (two) times daily.  Dispense: 30 tablet; Refill: 0  4. Cervical radiculitis - CMP14+EGFR - predniSONE (STERAPRED UNI-PAK 21 TAB) 10 MG (21) TBPK tablet; Use as directed  Dispense: 21 tablet; Refill: 0 - diclofenac (VOLTAREN) 75 MG EC tablet; Take 1 tablet (75 mg total) by mouth 2 (two) times daily.  Dispense: 30 tablet; Refill: 0   5. Elevated blood pressure reading Will recheck in 1 month when pain is better controlled.  Will monitor at home  Labs pending Health Maintenance reviewed Diet and exercise  encouraged  Follow up plan: 1 month to recheck HTN   Evelina Dun, FNP

## 2020-05-03 NOTE — Patient Instructions (Signed)
Cervical Radiculopathy  Cervical radiculopathy happens when a nerve in the neck (a cervical nerve) is pinched or bruised. This condition can happen because of an injury to the cervical spine (vertebrae) in the neck, or as part of the normal aging process. Pressure on the cervical nerves can cause pain or numbness that travels from the neck all the way down into the arm and fingers. Usually, this condition gets better with rest. Treatment may be needed if the condition does not improve. What are the causes? This condition may be caused by:  A neck injury.  A bulging (herniated) disk.  Muscle spasms.  Muscle tightness in the neck because of overuse.  Arthritis.  Breakdown or degeneration in the bones and joints of the spine (spondylosis) due to aging.  Bone spurs that may develop near the cervical nerves. What are the signs or symptoms? Symptoms of this condition include:  Pain. The pain may travel from the neck to the arm and hand. The pain can be severe or irritating. It may be worse when you move your neck.  Numbness or tingling in your arm or hand.  Weakness in the affected arm and hand, in severe cases. How is this diagnosed? This condition may be diagnosed based on your symptoms, your medical history, and a physical exam. You may also have tests, including:  X-rays.  A CT scan.  An MRI.  An electromyogram (EMG).  Nerve conduction tests. How is this treated? In many cases, treatment is not needed for this condition. With rest, the condition usually gets better over time. If treatment is needed, options may include:  Wearing a soft neck collar (cervical collar) for short periods of time, as told by your health care provider.  Doing physical therapy to strengthen your neck muscles.  Taking medicines, such as NSAIDs or oral corticosteroids.  Having spinal injections, in severe cases.  Having surgery. This may be needed if other treatments do not help. Different types  of surgery may be done depending on the cause of this condition. Follow these instructions at home: If you have a cervical collar:  Wear it as told by your health care provider. Remove it only as told by your health care provider.  Ask your health care provider if you can remove the collar for cleaning and bathing. If you are allowed to remove the collar for cleaning or bathing: ? Follow instructions from your health care provider about how to remove the collar safely. ? Clean the collar by wiping it with mild soap and water and drying it completely. ? Take out any removable pads in the collar every 1-2 days, and wash them by hand with soap and water. Let them air-dry completely before you put them back in the collar. ? Check your skin under the collar for irritation or sores. If you see any, tell your health care provider. Managing pain  Take over-the-counter and prescription medicines only as told by your health care provider.  If directed, put ice on the affected area. ? If you have a soft neck collar, remove it as told by your health care provider. ? Put ice in a plastic bag. ? Place a towel between your skin and the bag. ? Leave the ice on for 20 minutes, 2-3 times a day.  If applying ice does not help, you can try using heat. Use the heat source that your health care provider recommends, such as a moist heat pack or a heating pad. ? Place a towel   between your skin and the heat source. ? Leave the heat on for 20-30 minutes. ? Remove the heat if your skin turns bright red. This is especially important if you are unable to feel pain, heat, or cold. You may have a greater risk of getting burned.  Try a gentle neck and shoulder massage to help relieve symptoms.      Activity  Rest as needed.  Return to your normal activities as told by your health care provider. Ask your health care provider what activities are safe for you.  Do stretching and strengthening exercises as told by  your health care provider or physical therapist.  Do not lift anything that is heavier than 10 lb (4.5 kg) until your health care provider tells you that it is safe. General instructions  Use a flat pillow when you sleep.  Do not drive while wearing a cervical collar. If you do not have a cervical collar, ask your health care provider if it is safe to drive while your neck heals.  Ask your health care provider if the medicine prescribed to you requires you to avoid driving or using heavy machinery.  Do not use any products that contain nicotine or tobacco, such as cigarettes, e-cigarettes, and chewing tobacco. These can delay healing. If you need help quitting, ask your health care provider.  Keep all follow-up visits as told by your health care provider. This is important. Contact a health care provider if:  Your condition does not improve with treatment. Get help right away if:  Your pain gets much worse and cannot be controlled with medicines.  You have weakness or numbness in your hand, arm, face, or leg.  You have a high fever.  You have a stiff, rigid neck.  You lose control of your bowels or your bladder (have incontinence).  You have trouble with walking, balance, or speaking. Summary  Cervical radiculopathy happens when a nerve in the neck is pinched or bruised.  A nerve can get pinched from a bulging disk, arthritis, muscle spasms, or an injury to the neck.  Symptoms include pain, tingling, or numbness radiating from the neck into the arm or hand. Weakness can also occur in severe cases.  Treatment may include rest, wearing a cervical collar, and physical therapy. Medicines may be prescribed to help with pain. In severe cases, injections or surgery may be needed. This information is not intended to replace advice given to you by your health care provider. Make sure you discuss any questions you have with your health care provider. Document Revised: 01/15/2018  Document Reviewed: 01/15/2018 [employer]vier Patient Education  2021 [employer]  

## 2020-05-04 LAB — CMP14+EGFR
ALT: 11 IU/L (ref 0–32)
AST: 17 IU/L (ref 0–40)
Albumin/Globulin Ratio: 1.5 (ref 1.2–2.2)
Albumin: 4.4 g/dL (ref 3.7–4.7)
Alkaline Phosphatase: 69 IU/L (ref 44–121)
BUN/Creatinine Ratio: 13 (ref 12–28)
BUN: 10 mg/dL (ref 8–27)
Bilirubin Total: 0.3 mg/dL (ref 0.0–1.2)
CO2: 18 mmol/L — ABNORMAL LOW (ref 20–29)
Calcium: 9.6 mg/dL (ref 8.7–10.3)
Chloride: 103 mmol/L (ref 96–106)
Creatinine, Ser: 0.75 mg/dL (ref 0.57–1.00)
GFR calc Af Amer: 93 mL/min/{1.73_m2} (ref >59)
GFR calc non Af Amer: 80 mL/min/{1.73_m2} (ref >59)
Globulin, Total: 3 g/dL (ref 1.5–4.5)
Glucose: 92 mg/dL (ref 65–99)
Potassium: 4 mmol/L (ref 3.5–5.2)
Sodium: 144 mmol/L (ref 134–144)
Total Protein: 7.4 g/dL (ref 6.0–8.5)

## 2020-05-09 DIAGNOSIS — M62830 Muscle spasm of back: Secondary | ICD-10-CM | POA: Diagnosis not present

## 2020-05-09 DIAGNOSIS — M9901 Segmental and somatic dysfunction of cervical region: Secondary | ICD-10-CM | POA: Diagnosis not present

## 2020-05-10 DIAGNOSIS — M9901 Segmental and somatic dysfunction of cervical region: Secondary | ICD-10-CM | POA: Diagnosis not present

## 2020-05-10 DIAGNOSIS — M62830 Muscle spasm of back: Secondary | ICD-10-CM | POA: Diagnosis not present

## 2020-05-14 DIAGNOSIS — M62830 Muscle spasm of back: Secondary | ICD-10-CM | POA: Diagnosis not present

## 2020-05-14 DIAGNOSIS — M9901 Segmental and somatic dysfunction of cervical region: Secondary | ICD-10-CM | POA: Diagnosis not present

## 2020-05-21 DIAGNOSIS — M62830 Muscle spasm of back: Secondary | ICD-10-CM | POA: Diagnosis not present

## 2020-05-21 DIAGNOSIS — M9901 Segmental and somatic dysfunction of cervical region: Secondary | ICD-10-CM | POA: Diagnosis not present

## 2020-05-23 DIAGNOSIS — M9901 Segmental and somatic dysfunction of cervical region: Secondary | ICD-10-CM | POA: Diagnosis not present

## 2020-05-23 DIAGNOSIS — M62830 Muscle spasm of back: Secondary | ICD-10-CM | POA: Diagnosis not present

## 2020-05-24 DIAGNOSIS — M62830 Muscle spasm of back: Secondary | ICD-10-CM | POA: Diagnosis not present

## 2020-05-24 DIAGNOSIS — M9901 Segmental and somatic dysfunction of cervical region: Secondary | ICD-10-CM | POA: Diagnosis not present

## 2020-05-28 DIAGNOSIS — M62830 Muscle spasm of back: Secondary | ICD-10-CM | POA: Diagnosis not present

## 2020-05-28 DIAGNOSIS — M9901 Segmental and somatic dysfunction of cervical region: Secondary | ICD-10-CM | POA: Diagnosis not present

## 2020-05-31 DIAGNOSIS — M62830 Muscle spasm of back: Secondary | ICD-10-CM | POA: Diagnosis not present

## 2020-05-31 DIAGNOSIS — M9901 Segmental and somatic dysfunction of cervical region: Secondary | ICD-10-CM | POA: Diagnosis not present

## 2020-06-01 ENCOUNTER — Ambulatory Visit: Payer: Medicare HMO | Admitting: Family

## 2020-06-04 DIAGNOSIS — M62830 Muscle spasm of back: Secondary | ICD-10-CM | POA: Diagnosis not present

## 2020-06-04 DIAGNOSIS — M9901 Segmental and somatic dysfunction of cervical region: Secondary | ICD-10-CM | POA: Diagnosis not present

## 2020-06-07 DIAGNOSIS — M9901 Segmental and somatic dysfunction of cervical region: Secondary | ICD-10-CM | POA: Diagnosis not present

## 2020-06-07 DIAGNOSIS — M62830 Muscle spasm of back: Secondary | ICD-10-CM | POA: Diagnosis not present

## 2020-06-11 DIAGNOSIS — M9901 Segmental and somatic dysfunction of cervical region: Secondary | ICD-10-CM | POA: Diagnosis not present

## 2020-06-11 DIAGNOSIS — M62830 Muscle spasm of back: Secondary | ICD-10-CM | POA: Diagnosis not present

## 2020-06-13 DIAGNOSIS — M62830 Muscle spasm of back: Secondary | ICD-10-CM | POA: Diagnosis not present

## 2020-06-13 DIAGNOSIS — M9901 Segmental and somatic dysfunction of cervical region: Secondary | ICD-10-CM | POA: Diagnosis not present

## 2020-06-27 DIAGNOSIS — M62830 Muscle spasm of back: Secondary | ICD-10-CM | POA: Diagnosis not present

## 2020-06-27 DIAGNOSIS — M9901 Segmental and somatic dysfunction of cervical region: Secondary | ICD-10-CM | POA: Diagnosis not present

## 2020-07-16 ENCOUNTER — Encounter: Payer: Self-pay | Admitting: Family Medicine

## 2020-07-24 ENCOUNTER — Ambulatory Visit (INDEPENDENT_AMBULATORY_CARE_PROVIDER_SITE_OTHER): Payer: Medicare Other

## 2020-07-24 VITALS — Ht 64.0 in | Wt 230.0 lb

## 2020-07-24 DIAGNOSIS — Z0000 Encounter for general adult medical examination without abnormal findings: Secondary | ICD-10-CM | POA: Diagnosis not present

## 2020-07-24 NOTE — Patient Instructions (Addendum)
Sierra Greer , Thank you for taking time to come for your Medicare Wellness Visit. I appreciate your ongoing commitment to your health goals. Please review the following plan we discussed and let me know if I can assist you in the future.   Screening recommendations/referrals: Colonoscopy: DECLINED Mammogram: DECLINED Bone Density: DECLINED Recommended yearly ophthalmology/optometry visit for glaucoma screening and checkup Recommended yearly dental visit for hygiene and checkup  Vaccinations: Influenza vaccine: Done 05/03/2020 - Repeat Fall 2022 Pneumococcal vaccine: Done 03/30/2018 - Due for second Tdap vaccine: DUE (every 10 years) Shingles vaccine: Shingrix discussed. Please contact your pharmacy for coverage information.    Covid-19: Done 06/02/2019 & 07/05/2019 - had one booster - we need date - likely due for second booster  Advanced directives: Advance directive discussed with you today. I have provided a copy for you to complete at home and have notarized. Once this is complete please bring a copy in to our office so we can scan it into your chart.  Conditions/risks identified: Aim for 30 minutes of exercise and stretching each day, drink 6-8 glasses of water and eat lots of fruits and vegetables. Continue fall prevention.  Next appointment: Follow up in one year for your annual wellness visit   Preventive Care 72 Years and Older, Female Preventive care refers to lifestyle choices and visits with your health care provider that can promote health and wellness. What does preventive care include?  A yearly physical exam. This is also called an annual well check.  Dental exams once or twice a year.  Routine eye exams. Ask your health care provider how often you should have your eyes checked.  Personal lifestyle choices, including:  Daily care of your teeth and gums.  Regular physical activity.  Eating a healthy diet.  Avoiding tobacco and drug use.  Limiting alcohol  use.  Practicing safe sex.  Taking low-dose aspirin every day.  Taking vitamin and mineral supplements as recommended by your health care provider. What happens during an annual well check? The services and screenings done by your health care provider during your annual well check will depend on your age, overall health, lifestyle risk factors, and family history of disease. Counseling  Your health care provider may ask you questions about your:  Alcohol use.  Tobacco use.  Drug use.  Emotional well-being.  Home and relationship well-being.  Sexual activity.  Eating habits.  History of falls.  Memory and ability to understand (cognition).  Work and work Astronomer.  Reproductive health. Screening  You may have the following tests or measurements:  Height, weight, and BMI.  Blood pressure.  Lipid and cholesterol levels. These may be checked every 5 years, or more frequently if you are over 72 years old.  Skin check.  Lung cancer screening. You may have this screening every year starting at age 72 if you have a 30-pack-year history of smoking and currently smoke or have quit within the past 15 years.  Fecal occult blood test (FOBT) of the stool. You may have this test every year starting at age 72.  Flexible sigmoidoscopy or colonoscopy. You may have a sigmoidoscopy every 5 years or a colonoscopy every 10 years starting at age 72.  Hepatitis C blood test.  Hepatitis B blood test.  Sexually transmitted disease (STD) testing.  Diabetes screening. This is done by checking your blood sugar (glucose) after you have not eaten for a while (fasting). You may have this done every 1-3 years.  Bone density scan. This  is done to screen for osteoporosis. You may have this done starting at age 72.  Mammogram. This may be done every 1-2 years. Talk to your health care provider about how often you should have regular mammograms. Talk with your health care provider about  your test results, treatment options, and if necessary, the need for more tests. Vaccines  Your health care provider may recommend certain vaccines, such as:  Influenza vaccine. This is recommended every year.  Tetanus, diphtheria, and acellular pertussis (Tdap, Td) vaccine. You may need a Td booster every 10 years.  Zoster vaccine. You may need this after age 72.  Pneumococcal 13-valent conjugate (PCV13) vaccine. One dose is recommended after age 72.  Pneumococcal polysaccharide (PPSV23) vaccine. One dose is recommended after age 72. Talk to your health care provider about which screenings and vaccines you need and how often you need them. This information is not intended to replace advice given to you by your health care provider. Make sure you discuss any questions you have with your health care provider. Document Released: 03/23/2015 Document Revised: 11/14/2015 Document Reviewed: 12/26/2014 Elsevier Interactive Patient Education  2017 [place] Prevention in the Home Falls can cause injuries. They can happen to people of all ages. There are many things you can do to make your home safe and to help prevent falls. What can I do on the outside of my home?  Regularly fix the edges of walkways and driveways and fix any cracks.  Remove anything that might make you trip as you walk through a door, such as a raised step or threshold.  Trim any bushes or trees on the path to your home.  Use bright outdoor lighting.  Clear any walking paths of anything that might make someone trip, such as rocks or tools.  Regularly check to see if handrails are loose or broken. Make sure that both sides of any steps have handrails.  Any raised decks and porches should have guardrails on the edges.  Have any leaves, snow, or ice cleared regularly.  Use sand or salt on walking paths during winter.  Clean up any spills in your garage right away. This includes oil or grease spills. What  can I do in the bathroom?  Use night lights.  Install grab bars by the toilet and in the tub and shower. Do not use towel bars as grab bars.  Use non-skid mats or decals in the tub or shower.  If you need to sit down in the shower, use a plastic, non-slip stool.  Keep the floor dry. Clean up any water that spills on the floor as soon as it happens.  Remove soap buildup in the tub or shower regularly.  Attach bath mats securely with double-sided non-slip rug tape.  Do not have throw rugs and other things on the floor that can make you trip. What can I do in the bedroom?  Use night lights.  Make sure that you have a light by your bed that is easy to reach.  Do not use any sheets or blankets that are too big for your bed. They should not hang down onto the floor.  Have a firm chair that has side arms. You can use this for support while you get dressed.  Do not have throw rugs and other things on the floor that can make you trip. What can I do in the kitchen?  Clean up any spills right away.  Avoid walking on wet floors.  Keep items that you use a lot in easy-to-reach places.  If you need to reach something above you, use a strong step stool that has a grab bar.  Keep electrical cords out of the way.  Do not use floor polish or wax that makes floors slippery. If you must use wax, use non-skid floor wax.  Do not have throw rugs and other things on the floor that can make you trip. What can I do with my stairs?  Do not leave any items on the stairs.  Make sure that there are handrails on both sides of the stairs and use them. Fix handrails that are broken or loose. Make sure that handrails are as long as the stairways.  Check any carpeting to make sure that it is firmly attached to the stairs. Fix any carpet that is loose or worn.  Avoid having throw rugs at the top or bottom of the stairs. If you do have throw rugs, attach them to the floor with carpet tape.  Make sure  that you have a light switch at the top of the stairs and the bottom of the stairs. If you do not have them, ask someone to add them for you. What else can I do to help prevent falls?  Wear shoes that:  Do not have high heels.  Have rubber bottoms.  Are comfortable and fit you well.  Are closed at the toe. Do not wear sandals.  If you use a stepladder:  Make sure that it is fully opened. Do not climb a closed stepladder.  Make sure that both sides of the stepladder are locked into place.  Ask someone to hold it for you, if possible.  Clearly mark and make sure that you can see:  Any grab bars or handrails.  First and last steps.  Where the edge of each step is.  Use tools that help you move around (mobility aids) if they are needed. These include:  Canes.  Walkers.  Scooters.  Crutches.  Turn on the lights when you go into a dark area. Replace any light bulbs as soon as they burn out.  Set up your furniture so you have a clear path. Avoid moving your furniture around.  If any of your floors are uneven, fix them.  If there are any pets around you, be aware of where they are.  Review your medicines with your doctor. Some medicines can make you feel dizzy. This can increase your chance of falling. Ask your doctor what other things that you can do to help prevent falls. This information is not intended to replace advice given to you by your health care provider. Make sure you discuss any questions you have with your health care provider. Document Released: 12/21/2008 Document Revised: 08/02/2015 Document Reviewed: 03/31/2014 Elsevier Interactive Patient Education  2017 [employer]  Calorie Counting for [employer] Calories are units of energy. Your body needs a certain number of calories from food to keep going throughout the day. When you eat or drink more calories than your body needs, your body stores the extra calories mostly as fat. When you eat or drink  fewer calories than your body needs, your body burns fat to get the energy it needs. Calorie counting means keeping track of how many calories you eat and drink each day. Calorie counting can be helpful if you need to lose weight. If you eat fewer calories than your body needs, you should lose weight. Ask your health care  provider what a healthy weight is for you. For calorie counting to work, you will need to eat the right number of calories each day to lose a healthy amount of weight per week. A dietitian can help you figure out how many calories you need in a day and will suggest ways to reach your calorie goal.  A healthy amount of weight to lose each week is usually 1-2 lb (0.5-0.9 kg). This usually means that your daily calorie intake should be reduced by 500-750 calories.  Eating 1,200-1,500 calories a day can help most women lose weight.  Eating 1,500-1,800 calories a day can help most men lose weight. What do I need to know about calorie counting? Work with your health care provider or dietitian to determine how many calories you should get each day. To meet your daily calorie goal, you will need to:  Find out how many calories are in each food that you would like to eat. Try to do this before you eat.  Decide how much of the food you plan to eat.  Keep a food log. Do this by writing down what you ate and how many calories it had. To successfully lose weight, it is important to balance calorie counting with a healthy lifestyle that includes regular activity. Where do I find calorie information? The number of calories in a food can be found on a Nutrition Facts label. If a food does not have a Nutrition Facts label, try to look up the calories online or ask your dietitian for help. Remember that calories are listed per serving. If you choose to have more than one serving of a food, you will have to multiply the calories per serving by the number of servings you plan to eat. For example,  the label on a package of bread might say that a serving size is 1 slice and that there are 90 calories in a serving. If you eat 1 slice, you will have eaten 90 calories. If you eat 2 slices, you will have eaten 180 calories.   How do I keep a food log? After each time that you eat, record the following in your food log as soon as possible:  What you ate. Be sure to include toppings, sauces, and other extras on the food.  How much you ate. This can be measured in cups, ounces, or number of items.  How many calories were in each food and drink.  The total number of calories in the food you ate. Keep your food log near you, such as in a pocket-sized notebook or on an app or website on your mobile phone. Some programs will calculate calories for you and show you how many calories you have left to meet your daily goal. What are some portion-control tips?  Know how many calories are in a serving. This will help you know how many servings you can have of a certain food.  Use a measuring cup to measure serving sizes. You could also try weighing out portions on a kitchen scale. With time, you will be able to estimate serving sizes for some foods.  Take time to put servings of different foods on your favorite plates or in your favorite bowls and cups so you know what a serving looks like.  Try not to eat straight from a food's packaging, such as from a bag or box. Eating straight from the package makes it hard to see how much you are eating and can lead to  overeating. Put the amount you would like to eat in a cup or on a plate to make sure you are eating the right portion.  Use smaller plates, glasses, and bowls for smaller portions and to prevent overeating.  Try not to multitask. For example, avoid watching TV or using your computer while eating. If it is time to eat, sit down at a table and enjoy your food. This will help you recognize when you are full. It will also help you be more mindful of  what and how much you are eating. What are tips for following this plan? Reading food labels  Check the calorie count compared with the serving size. The serving size may be smaller than what you are used to eating.  Check the source of the calories. Try to choose foods that are high in protein, fiber, and vitamins, and low in saturated fat, trans fat, and sodium. Shopping  Read nutrition labels while you shop. This will help you make healthy decisions about which foods to buy.  Pay attention to nutrition labels for low-fat or fat-free foods. These foods sometimes have the same number of calories or more calories than the full-fat versions. They also often have added sugar, starch, or salt to make up for flavor that was removed with the fat.  Make a grocery list of lower-calorie foods and stick to it. Cooking  Try to cook your favorite foods in a healthier way. For example, try baking instead of frying.  Use low-fat dairy products. Meal planning  Use more fruits and vegetables. One-half of your plate should be fruits and vegetables.  Include lean proteins, such as chicken, [place], and fish. Lifestyle Each week, aim to do one of the following:  150 minutes of moderate exercise, such as walking.  75 minutes of vigorous exercise, such as running. General information  Know how many calories are in the foods you eat most often. This will help you calculate calorie counts faster.  Find a way of tracking calories that works for you. Get creative. Try different apps or programs if writing down calories does not work for you. What foods should I eat?  Eat nutritious foods. It is better to have a nutritious, high-calorie food, such as an avocado, than a food with few nutrients, such as a bag of potato chips.  Use your calories on foods and drinks that will fill you up and will not leave you hungry soon after eating. ? Examples of foods that fill you up are nuts and nut butters,  vegetables, lean proteins, and high-fiber foods such as whole grains. High-fiber foods are foods with more than 5 g of fiber per serving.  Pay attention to calories in drinks. Low-calorie drinks include water and unsweetened drinks. The items listed above may not be a complete list of foods and beverages you can eat. Contact a dietitian for more information.   What foods should I limit? Limit foods or drinks that are not good sources of vitamins, minerals, or protein or that are high in unhealthy fats. These include:  Candy.  Other sweets.  Sodas, specialty coffee drinks, alcohol, and juice. The items listed above may not be a complete list of foods and beverages you should avoid. Contact a dietitian for more information. How do I count calories when eating out?  Pay attention to portions. Often, portions are much larger when eating out. Try these tips to keep portions smaller: ? Consider sharing a meal instead of getting your own. ?  If you get your own meal, eat only half of it. Before you start eating, ask for a container and put half of your meal into it. ? When available, consider ordering smaller portions from the menu instead of full portions.  Pay attention to your food and drink choices. Knowing the way food is cooked and what is included with the meal can help you eat fewer calories. ? If calories are listed on the menu, choose the lower-calorie options. ? Choose dishes that include vegetables, fruits, whole grains, low-fat dairy products, and lean proteins. ? Choose items that are boiled, broiled, grilled, or steamed. Avoid items that are buttered, battered, fried, or served with cream sauce. Items labeled as crispy are usually fried, unless stated otherwise. ? Choose water, low-fat milk, unsweetened iced tea, or other drinks without added sugar. If you want an alcoholic beverage, choose a lower-calorie option, such as a glass of wine or light beer. ? Ask for dressings, sauces, and  syrups on the side. These are usually high in calories, so you should limit the amount you eat. ? If you want a salad, choose a garden salad and ask for grilled meats. Avoid extra toppings such as bacon, cheese, or fried items. Ask for the dressing on the side, or ask for olive oil and vinegar or lemon to use as dressing.  Estimate how many servings of a food you are given. Knowing serving sizes will help you be aware of how much food you are eating at restaurants. Where to find more information  Centers for Disease Control and Prevention: [employer]Ex[employer].com.br  [place] Department of Agriculture: [employer] Summary  Calorie counting means keeping track of how many calories you eat and drink each day. If you eat fewer calories than your body needs, you should lose weight.  A healthy amount of weight to lose per week is usually 1-2 lb (0.5-0.9 kg). This usually means reducing your daily calorie intake by 500-750 calories.  The number of calories in a food can be found on a Nutrition Facts label. If a food does not have a Nutrition Facts label, try to look up the calories online or ask your dietitian for help.  Use smaller plates, glasses, and bowls for smaller portions and to prevent overeating.  Use your calories on foods and drinks that will fill you up and not leave you hungry shortly after a meal. This information is not intended to replace advice given to you by your health care provider. Make sure you discuss any questions you have with your health care provider. Document Revised: 04/07/2019 Document Reviewed: 04/07/2019 Elsevier Patient Education  2021 [employer].

## 2020-07-24 NOTE — Progress Notes (Signed)
Subjective:   [name] Greer is a 70 y.o. female who presents for Medicare Annual (Subsequent) preventive examination.  Virtual Visit via Telephone Note  I connected with  [name] Debella on 07/24/20 at  2:00 PM EDT by telephone and verified that I am speaking with the correct person using two identifiers.  Location: Patient: Home Provider: [facility] Persons participating in the virtual visit: patient/Nurse Health Advisor   I discussed the limitations, risks, security and privacy concerns of performing an evaluation and management service by telephone and the availability of in person appointments. The patient expressed understanding and agreed to proceed.  Interactive audio and video telecommunications were attempted between this nurse and patient, however failed, due to patient having technical difficulties OR patient did not have access to video capability.  We continued and completed visit with audio only.  Some vital signs may be absent or patient reported.   Blessed Cotham E Tyliyah Mcmeekin, LPN   Review of Systems     Cardiac Risk Factors include: advanced age (>5055men, 24>65 women);obesity (BMI >30kg/m2);sedentary lifestyle;dyslipidemia;hypertension     Objective:    Today's Vitals   07/24/20 1353  Weight: 230 lb (104.3 kg)  Height: 5\' 4"  (1.626 m)  PainSc: 10-Worst pain ever   Body mass index is 39.48 kg/m.  Advanced Directives 07/24/2020 04/30/2020 09/14/2018 05/13/2015  Does Patient Have a Medical Advance Directive? No No No No  Would patient like information on creating a medical advance directive? Yes (MAU/Ambulatory/Procedural Areas - Information given) - No - Patient declined No - patient declined information    Current Medications (verified) Outpatient Encounter Medications as of 07/24/2020  Medication Sig  . diclofenac (VOLTAREN) 75 MG EC tablet Take 1 tablet (75 mg total) by mouth 2 (two) times daily.  . diclofenac Sodium (VOLTAREN) 1 % GEL Apply 2 g topically 4 (four) times daily as  needed.  . diphenhydrAMINE (BENADRYL) 25 MG tablet Take 50 mg by mouth every 6 (six) hours as needed (Swelling, breathing).  . methocarbamol (ROBAXIN) 500 MG tablet Take 1 tablet (500 mg total) by mouth every 8 (eight) hours as needed for muscle spasms.  [name]. omeprazole (PRILOSEC) 20 MG capsule Take 20 mg by mouth daily.  . predniSONE (STERAPRED UNI-PAK 21 TAB) 10 MG (21) TBPK tablet Use as directed   No facility-administered encounter medications on file as of 07/24/2020.    Allergies (verified) Penicillins   History: Past Medical History:  Diagnosis Date  . Diverticulitis   . GERD (gastroesophageal reflux disease)   . Hyperlipidemia   . Leaky heart valve    Past Surgical History:  Procedure Laterality Date  . ABDOMINAL HYSTERECTOMY     Family History  Problem Relation Age of Onset  . Arthritis Mother   . COPD Father   . Mesothelioma Father   . Hepatitis C Sister    Social History   Socioeconomic History  . Marital status: Divorced    Spouse name: Not on file  . Number of children: 4  . Years of education: 13.5  . Highest education level: Some college, no degree  Occupational History  . 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 Smoking status: Former Smoker    Packs/day: 0.50    Types: Cigarettes    Start date: 02/09/1994    Quit date: 01/21/2002    Years since quitting: 18.5  . Smokeless tobacco: Never Used  Vaping Use  . Vaping Use: Never used  Substance and Sexual Activity  . Alcohol  use: No  . Drug use: No  . Sexual activity: Not Currently    Birth control/protection: Surgical  Other Topics Concern  . Not on file  Social History Narrative   Lives alone in a travel trailer in daughter's back yard - severe pain in knees and sometimes shoulders due to arthritis - uses 2 canes to help take stress off of knees while walking    Social Determinants of Health   Financial Resource Strain: Low Risk   . Difficulty of Paying Living Expenses:  Not hard at all  Food Insecurity: No Food Insecurity  . Worried About Programme researcher, broadcasting/film/video in the Last Year: Never true  . Ran Out of Food in the Last Year: Never true  Transportation Needs: No Transportation Needs  . Lack of Transportation (Medical): No  . Lack of Transportation (Non-Medical): No  Physical Activity: Insufficiently Active  . Days of Exercise per Week: 7 days  . Minutes of Exercise per Session: 10 min  Stress: No Stress Concern Present  . Feeling of Stress : Not at all  Social Connections: Socially Isolated  . Frequency of Communication with Friends and Family: More than three times a week  . Frequency of Social Gatherings with Friends and Family: More than three times a week  . Attends Religious Services: Never  . Active Member of Clubs or Organizations: No  . Attends Banker Meetings: Never  . Marital Status: Divorced    Tobacco Counseling Counseling given: Not Answered   Clinical Intake:  Pre-visit preparation completed: Yes  Pain : 0-10 Pain Score: 10-Worst pain ever Pain Type: Chronic pain Pain Location: Knee Pain Orientation: Right,Left Pain Descriptors / Indicators: Aching,Sharp Pain Onset: More than a month ago Pain Frequency: Intermittent     BMI - recorded: 39.48 Nutritional Status: BMI > 30  Obese Nutritional Risks: None Diabetes: No  How often do you need to have someone help you when you read instructions, pamphlets, or other written materials from your doctor or pharmacy?: 1 - Never  Diabetic? No  Interpreter Needed?: No  Information entered by :: Tosh Glaze, LPN   Activities of Daily Living In your present state of health, do you have any difficulty performing the following activities: 07/24/2020  Hearing? N  Vision? N  Difficulty concentrating or making decisions? N  Walking or climbing stairs? Y  Dressing or bathing? N  Doing errands, shopping? N  Preparing Food and eating ? N  Using the Toilet? N  In the  past six months, have you accidently leaked urine? N  Do you have problems with loss of bowel control? N  Managing your Medications? N  Managing your Finances? N  Housekeeping or managing your Housekeeping? N  Some recent data might be hidden    Patient Care Team: Junie Spencer, FNP as PCP - General (Family Medicine) Moody Bruins as Physician Assistant (Chiropractic Medicine) Michaelle Copas, MD as Referring Physician (Optometry)  Indicate any recent Medical Services you may have received from other than [facility] providers in the past year (date may be approximate).     Assessment:   This is a routine wellness examination for [place].  Hearing/Vision screen  Hearing Screening   125Hz  250Hz  500Hz  1000Hz  2000Hz  3000Hz  4000Hz  6000Hz  8000Hz   Right ear:           Left ear:           Comments: Denies hearing difficulties   Vision Screening Comments: Wears eyeglasses -  Sees Dr Conley Rolls in [place] every 2-3 years (when she needs new glasses or has a problem) - behind on eye exam.  Dietary issues and exercise activities discussed: Current Exercise Habits: Home exercise routine, Type of exercise: walking;stretching, Time (Minutes): 10, Frequency (Times/Week): 7, Weekly Exercise (Minutes/Week): 70, Intensity: Mild, Exercise limited by: orthopedic condition(s)  Goals Addressed            This Visit's Progress   . DIET - INCREASE WATER INTAKE   On track    Try to drink 6-8 glasses of water daily.    . Exercise 3x per week (30 min per time)       Low fat, low carb diet - Try to lose 1-2 lbs per week and be more active.      Depression Screen PHQ 2/9 Scores 07/24/2020 05/03/2020 09/14/2018 12/26/2015  PHQ - 2 Score 0 0 0 1    Fall Risk Fall Risk  07/24/2020 05/03/2020 09/14/2018 12/26/2015  Falls in the past year? 0 0 0 No  Number falls in past yr: 0 - - -  Injury with Fall? 0 - - -  Risk for fall due to : Orthopedic patient;Impaired balance/gait;Impaired vision - - -  Follow up  Education provided;Falls prevention discussed - - -    FALL RISK PREVENTION PERTAINING TO THE HOME:  Any stairs in or around the home? No  If so, are there any without handrails? No  Home free of loose throw rugs in walkways, pet beds, electrical cords, etc? Yes  Adequate lighting in your home to reduce risk of falls? Yes   ASSISTIVE DEVICES UTILIZED TO PREVENT FALLS:  Life alert? No  Use of a cane, walker or w/c? Yes  Grab bars in the bathroom? Yes  Shower chair or bench in shower? Yes  Elevated toilet seat or a handicapped toilet? No   TIMED UP AND GO:  Was the test performed? No . Telephonic visit.  Cognitive Function: Normal cognitive status assessed by direct observation by this Nurse Health Advisor. No abnormalities found.      6CIT Screen 09/14/2018  What Year? 0 points  What month? 0 points  What time? 0 points  Count back from 20 0 points  Months in reverse 0 points  Repeat phrase 0 points  Total Score 0    Immunizations Immunization History  Administered Date(s) Administered  . Fluad Quad(high Dose 65+) 05/03/2020  . Moderna Sars-Covid-2 Vaccination 06/02/2019, 07/05/2019  . Pneumococcal Polysaccharide-23 03/30/2018    TDAP status: Due, Education has been provided regarding the importance of this vaccine. Advised may receive this vaccine at local pharmacy or Health Dept. Aware to provide a copy of the vaccination record if obtained from local pharmacy or Health Dept. Verbalized acceptance and understanding.  Flu Vaccine status: Up to date  Pneumococcal vaccine status: Due, Education has been provided regarding the importance of this vaccine. Advised may receive this vaccine at local pharmacy or Health Dept. Aware to provide a copy of the vaccination record if obtained from local pharmacy or Health Dept. Verbalized acceptance and understanding.  Covid-19 vaccine status: Completed vaccines  Qualifies for Shingles Vaccine? Yes   Zostavax completed No    Shingrix Completed?: No.    Education has been provided regarding the importance of this vaccine. Patient has been advised to call insurance company to determine out of pocket expense if they have not yet received this vaccine. Advised may also receive vaccine at local pharmacy or Health Dept. Verbalized acceptance and  understanding.  Screening Tests Health Maintenance  Topic Date Due  . COVID-19 Vaccine (3 - Booster for Moderna series) 12/05/2019  . MAMMOGRAM  05/03/2021 (Originally 02/10/1999)  . DEXA SCAN  05/03/2021 (Originally 02/09/2014)  . COLONOSCOPY (Pts 45-55yrs Insurance coverage will need to be confirmed)  05/03/2021 (Originally 02/09/1994)  . TETANUS/TDAP  05/03/2021 (Originally 02/10/1968)  . PNA vac Low Risk Adult (2 of 2 - PCV13) 05/03/2021 (Originally 03/31/2019)  . INFLUENZA VACCINE  10/08/2020  . Hepatitis C Screening  Completed  . HPV VACCINES  Aged Out    Health Maintenance  Health Maintenance Due  Topic Date Due  . COVID-19 Vaccine (3 - Booster for Moderna series) 12/05/2019    Colorectal cancer screening: No longer required. patient declined  Mammogram status: No longer required due to patient declined.  Bone Density Status: patient declined screening  Lung Cancer Screening: (Low Dose CT Chest recommended if Age 60-80 years, 30 pack-year currently smoking OR have quit w/in 15years.) does not qualify.   Additional Screening:  Hepatitis C Screening: does qualify; Completed 03/30/2018  Vision Screening: Recommended annual ophthalmology exams for early detection of glaucoma and other disorders of the eye. Is the patient up to date with their annual eye exam?  No  Who is the provider or what is the name of the office in which the patient attends annual eye exams? Dr Conley Rolls in [place] If pt is not established with a provider, would they like to be referred to a provider to establish care? No .   Dental Screening: Recommended annual dental exams for proper oral  hygiene  Community Resource Referral / Chronic Care Management: CRR required this visit?  No   CCM required this visit?  No      Plan:     I have personally reviewed and noted the following in the patient's chart:   . Medical and social history . Use of alcohol, tobacco or illicit drugs  . Current medications and supplements including opioid prescriptions.  . Functional ability and status . Nutritional status . Physical activity . Advanced directives . List of other physicians . Hospitalizations, surgeries, and ER visits in previous 12 months . Vitals . Screenings to include cognitive, depression, and falls . Referrals and appointments  In addition, I have reviewed and discussed with patient certain preventive protocols, quality metrics, and best practice recommendations. A written personalized care plan for preventive services as well as general preventive health recommendations were provided to patient.     [place] Constable, LPN   4/48/1856   Nurse Notes: None

## 2020-07-25 ENCOUNTER — Encounter: Payer: Self-pay | Admitting: Family Medicine

## 2020-07-27 DIAGNOSIS — H40033 Anatomical narrow angle, bilateral: Secondary | ICD-10-CM | POA: Diagnosis not present

## 2020-07-27 DIAGNOSIS — H2513 Age-related nuclear cataract, bilateral: Secondary | ICD-10-CM | POA: Diagnosis not present

## 2020-09-11 DIAGNOSIS — H2513 Age-related nuclear cataract, bilateral: Secondary | ICD-10-CM | POA: Diagnosis not present

## 2020-09-11 DIAGNOSIS — H25043 Posterior subcapsular polar age-related cataract, bilateral: Secondary | ICD-10-CM | POA: Diagnosis not present

## 2020-09-11 DIAGNOSIS — H25013 Cortical age-related cataract, bilateral: Secondary | ICD-10-CM | POA: Diagnosis not present

## 2020-09-11 DIAGNOSIS — H18413 Arcus senilis, bilateral: Secondary | ICD-10-CM | POA: Diagnosis not present

## 2020-09-11 DIAGNOSIS — H2512 Age-related nuclear cataract, left eye: Secondary | ICD-10-CM | POA: Diagnosis not present

## 2020-11-19 DIAGNOSIS — H2512 Age-related nuclear cataract, left eye: Secondary | ICD-10-CM | POA: Diagnosis not present

## 2020-11-20 DIAGNOSIS — H2511 Age-related nuclear cataract, right eye: Secondary | ICD-10-CM | POA: Diagnosis not present

## 2020-12-10 DIAGNOSIS — H2511 Age-related nuclear cataract, right eye: Secondary | ICD-10-CM | POA: Diagnosis not present

## 2020-12-17 DIAGNOSIS — H04123 Dry eye syndrome of bilateral lacrimal glands: Secondary | ICD-10-CM | POA: Diagnosis not present

## 2021-01-26 DIAGNOSIS — H2513 Age-related nuclear cataract, bilateral: Secondary | ICD-10-CM | POA: Diagnosis not present

## 2021-01-26 DIAGNOSIS — H40033 Anatomical narrow angle, bilateral: Secondary | ICD-10-CM | POA: Diagnosis not present

## 2021-02-08 ENCOUNTER — Encounter: Payer: Self-pay | Admitting: Family

## 2021-02-08 ENCOUNTER — Ambulatory Visit (INDEPENDENT_AMBULATORY_CARE_PROVIDER_SITE_OTHER): Payer: Medicare Other | Admitting: Family

## 2021-02-08 VITALS — BP 130/78 | HR 72 | Temp 97.7°F | Ht 64.0 in | Wt 247.0 lb

## 2021-02-08 DIAGNOSIS — E785 Hyperlipidemia, unspecified: Secondary | ICD-10-CM | POA: Diagnosis not present

## 2021-02-08 DIAGNOSIS — G8929 Other chronic pain: Secondary | ICD-10-CM | POA: Diagnosis not present

## 2021-02-08 DIAGNOSIS — Z0001 Encounter for general adult medical examination with abnormal findings: Secondary | ICD-10-CM

## 2021-02-08 DIAGNOSIS — Z0000 Encounter for general adult medical examination without abnormal findings: Secondary | ICD-10-CM | POA: Diagnosis not present

## 2021-02-08 DIAGNOSIS — M546 Pain in thoracic spine: Secondary | ICD-10-CM | POA: Diagnosis not present

## 2021-02-08 DIAGNOSIS — K2100 Gastro-esophageal reflux disease with esophagitis, without bleeding: Secondary | ICD-10-CM | POA: Insufficient documentation

## 2021-02-08 DIAGNOSIS — Z136 Encounter for screening for cardiovascular disorders: Secondary | ICD-10-CM | POA: Diagnosis not present

## 2021-02-08 DIAGNOSIS — Z23 Encounter for immunization: Secondary | ICD-10-CM

## 2021-02-08 MED ORDER — METHOCARBAMOL 500 MG PO TABS: 500.0000 mg | ORAL_TABLET | Freq: Three times a day (TID) | ORAL | 2 refills | Status: DC | PRN

## 2021-02-08 NOTE — Progress Notes (Signed)
Subjective:    Patient ID: Sierra Greer, female    DOB: 1950-07-24, 70 y.o.   MRN: [id number]  Chief Complaint  Patient presents with   Medical Management of Chronic Issues   PT presents to the office today for CPE.  Gastroesophageal Reflux She complains of belching and heartburn. This is a chronic problem. The current episode started more than 1 year ago. The problem occurs occasionally. The problem has been waxing and waning. She has tried a PPI for the symptoms. The treatment provided moderate relief.  Back Pain This is a chronic problem. The current episode started more than 1 year ago. The problem occurs intermittently. The pain is present in the thoracic spine. The pain is at a severity of 6/10. The pain is moderate. She has tried muscle relaxant, bed rest and NSAIDs for the symptoms. The treatment provided moderate relief.     Review of Systems  Gastrointestinal:  Positive for heartburn.  Musculoskeletal:  Positive for back pain.  All other systems reviewed and are negative.  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  Tobacco Use   Smoking status: Former    Packs/day: 0.50    Types: Cigarettes    Start date: 02/09/1994    Quit date: 01/21/2002    Years since quitting: 19.0   Smokeless tobacco: Never  Vaping Use   Vaping Use: Never used  Substance and Sexual Activity   Alcohol use: No   Drug use: No   Sexual activity: Not Currently    Birth control/protection: Surgical  Other Topics Concern   Not on file  Social History Narrative   Lives alone in a travel trailer in daughter's back yard - severe pain in  knees and sometimes shoulders due to arthritis - uses 2 canes to help take stress off of knees while walking    Social Determinants of Health   Financial Resource Strain: Low Risk    Difficulty of Paying Living Expenses: Not hard at all  Food Insecurity: No Food Insecurity   Worried About Charity fundraiser in the Last Year: Never true   [place] in the Last Year: Never true  Transportation Needs: No Transportation Needs   Lack of Transportation (Medical): No   Lack of Transportation (Non-Medical): No  Physical Activity: Insufficiently Active   Days of Exercise per Week: 7 days   Minutes of Exercise per Session: 10 min  Stress: No Stress Concern Present   Feeling of Stress : Not at all  Social Connections: Socially Isolated   Frequency of Communication with Friends and Family: More than three times a week   Frequency of Social Gatherings with Friends and Family: More than three times a week   Attends Religious Services: Never   Marine scientist or Organizations: No   Attends Archivist Meetings: Never   Marital Status: Divorced       Objective:   Physical Exam Vitals reviewed.  Constitutional:      General: She is not in acute distress.  Appearance: She is well-developed. She is obese.  HENT:     Head: Normocephalic and atraumatic.     Right Ear: Tympanic membrane normal.     Left Ear: Tympanic membrane normal.  Eyes:     Pupils: Pupils are equal, round, and reactive to light.  Neck:     Thyroid: No thyromegaly.  Cardiovascular:     Rate and Rhythm: Normal rate and regular rhythm.     Heart sounds: Normal heart sounds. No murmur heard. Pulmonary:     Effort: Pulmonary effort is normal. No respiratory distress.     Breath sounds: Normal breath sounds. No wheezing or rhonchi.  Abdominal:     General: Bowel sounds are normal. There is no distension.     Palpations: Abdomen is soft.     Tenderness: There is no abdominal tenderness.   Musculoskeletal:        General: No tenderness. Normal range of motion.     Cervical back: Normal range of motion and neck supple.     Right lower leg: Edema (trace) present.     Left lower leg: Edema (trace) present.  Skin:    General: Skin is warm and dry.  Neurological:     Mental Status: She is alert and oriented to person, place, and time.     Cranial Nerves: No cranial nerve deficit.     Deep Tendon Reflexes: Reflexes are normal and symmetric.  Psychiatric:        Behavior: Behavior normal.        Thought Content: Thought content normal.        Judgment: Judgment normal.     BP 130/78 Comment: patient reports at home  Pulse 72   Temp 97.7 F (36.5 C) (Temporal)   Ht '5\' 4"'  (1.626 m)   Wt 247 lb (112 kg)   BMI 42.40 kg/m       Assessment & Plan:  Jozette Castrellon comes in today with chief complaint of Medical Management of Chronic Issues   Diagnosis and orders addressed:  1. Need for pneumococcal vaccination - Pneumococcal conjugate vaccine 20-valent (Prevnar 20)  2. Need for immunization against influenza - Flu Vaccine QUAD High Dose(Fluad)  3. Annual physical exam - CMP14+EGFR - CBC with Differential/Platelet - Lipid panel - TSH  4. Morbid obesity ([place])  5. Gastroesophageal reflux disease with esophagitis, unspecified whether hemorrhage  6. Chronic bilateral thoracic back pain Rest ROM exercises  - methocarbamol (ROBAXIN) 500 MG tablet; Take 1 tablet (500 mg total) by mouth every 8 (eight) hours as needed for muscle spasms.  Dispense: 60 tablet; Refill: 2   Labs pending Health Maintenance reviewed Diet and exercise encouraged  Follow up plan: 6 months    Evelina Dun, FNP

## 2021-02-08 NOTE — Patient Instructions (Signed)
Health Maintenance After Age 72 After age 72, you are at a higher risk for certain long-term diseases and infections as well as injuries from falls. Falls are a major cause of broken bones and head injuries in people who are older than age 72. Getting regular preventive care can help to keep you healthy and well. Preventive care includes getting regular testing and making lifestyle changes as recommended by your health care provider. Talk with your health care provider about: Which screenings and tests you should have. A screening is a test that checks for a disease when you have no symptoms. A diet and exercise plan that is right for you. What should I know about screenings and tests to prevent falls? Screening and testing are the best ways to find a health problem early. Early diagnosis and treatment give you the best chance of managing medical conditions that are common after age 72. Certain conditions and lifestyle choices may make you more likely to have a fall. Your health care provider may recommend: Regular vision checks. Poor vision and conditions such as cataracts can make you more likely to have a fall. If you wear glasses, make sure to get your prescription updated if your vision changes. Medicine review. Work with your health care provider to regularly review all of the medicines you are taking, including over-the-counter medicines. Ask your health care provider about any side effects that may make you more likely to have a fall. Tell your health care provider if any medicines that you take make you feel dizzy or sleepy. Strength and balance checks. Your health care provider may recommend certain tests to check your strength and balance while standing, walking, or changing positions. Foot health exam. Foot pain and numbness, as well as not wearing proper footwear, can make you more likely to have a fall. Screenings, including: Osteoporosis screening. Osteoporosis is a condition that causes  the bones to get weaker and break more easily. Blood pressure screening. Blood pressure changes and medicines to control blood pressure can make you feel dizzy. Depression screening. You may be more likely to have a fall if you have a fear of falling, feel depressed, or feel unable to do activities that you used to do. Alcohol use screening. Using too much alcohol can affect your balance and may make you more likely to have a fall. Follow these instructions at home: Lifestyle Do not drink alcohol if: Your health care provider tells you not to drink. If you drink alcohol: Limit how much you have to: 0-1 drink a day for women. 0-2 drinks a day for men. Know how much alcohol is in your drink. In the [place] one drink equals one 12 oz bottle of beer (355 mL), one 5 oz glass of wine (148 mL), or one 1 oz glass of hard liquor (44 mL). Do not use any products that contain nicotine or tobacco. These products include cigarettes, chewing tobacco, and vaping devices, such as e-cigarettes. If you need help quitting, ask your health care provider. Activity  Follow a regular exercise program to stay fit. This will help you maintain your balance. Ask your health care provider what types of exercise are appropriate for you. If you need a cane or walker, use it as recommended by your health care provider. Wear supportive shoes that have nonskid soles. Safety  Remove any tripping hazards, such as rugs, cords, and clutter. Install safety equipment such as grab bars in bathrooms and safety rails on stairs. Keep rooms and walkways   well-lit. General instructions Talk with your health care provider about your risks for falling. Tell your health care provider if: You fall. Be sure to tell your health care provider about all falls, even ones that seem minor. You feel dizzy, tiredness (fatigue), or off-balance. Take over-the-counter and prescription medicines only as told by your health care provider. These include  supplements. Eat a healthy diet and maintain a healthy weight. A healthy diet includes low-fat dairy products, low-fat (lean) meats, and fiber from whole grains, beans, and lots of fruits and vegetables. Stay current with your vaccines. Schedule regular health, dental, and eye exams. Summary Having a healthy lifestyle and getting preventive care can help to protect your health and wellness after age 72. Screening and testing are the best way to find a health problem early and help you avoid having a fall. Early diagnosis and treatment give you the best chance for managing medical conditions that are more common for people who are older than age 72. Falls are a major cause of broken bones and head injuries in people who are older than age 72. Take precautions to prevent a fall at home. Work with your health care provider to learn what changes you can make to improve your health and wellness and to prevent falls. This information is not intended to replace advice given to you by your health care provider. Make sure you discuss any questions you have with your health care provider. Document Revised: 07/16/2020 Document Reviewed: 07/16/2020 Elsevier Patient Education  2022 [employer]  

## 2021-02-09 LAB — CMP14+EGFR
ALT: 13 IU/L (ref 0–32)
AST: 16 IU/L (ref 0–40)
Albumin/Globulin Ratio: 1.4 (ref 1.2–2.2)
Albumin: 4 g/dL (ref 3.7–4.7)
Alkaline Phosphatase: 75 IU/L (ref 44–121)
BUN/Creatinine Ratio: 16 (ref 12–28)
BUN: 12 mg/dL (ref 8–27)
Bilirubin Total: 0.3 mg/dL (ref 0.0–1.2)
CO2: 22 mmol/L (ref 20–29)
Calcium: 9.7 mg/dL (ref 8.7–10.3)
Chloride: 106 mmol/L (ref 96–106)
Creatinine, Ser: 0.77 mg/dL (ref 0.57–1.00)
Globulin, Total: 2.9 g/dL (ref 1.5–4.5)
Glucose: 104 mg/dL — ABNORMAL HIGH (ref 70–99)
Potassium: 4.2 mmol/L (ref 3.5–5.2)
Sodium: 142 mmol/L (ref 134–144)
Total Protein: 6.9 g/dL (ref 6.0–8.5)
eGFR: 82 mL/min/{1.73_m2} (ref >59)

## 2021-02-09 LAB — CBC WITH DIFFERENTIAL/PLATELET
Basophils Absolute: 0 10*3/uL (ref 0.0–0.2)
Basos: 1 %
EOS (ABSOLUTE): 0.2 10*3/uL (ref 0.0–0.4)
Eos: 2 %
Hematocrit: 46.7 % — ABNORMAL HIGH (ref 34.0–46.6)
Hemoglobin: 15.2 g/dL (ref 11.1–15.9)
Immature Grans (Abs): 0 10*3/uL (ref 0.0–0.1)
Immature Granulocytes: 0 %
Lymphocytes Absolute: 2.7 10*3/uL (ref 0.7–3.1)
Lymphs: 33 %
MCH: 28.4 pg (ref 26.6–33.0)
MCHC: 32.5 g/dL (ref 31.5–35.7)
MCV: 87 fL (ref 79–97)
Monocytes Absolute: 0.5 10*3/uL (ref 0.1–0.9)
Monocytes: 6 %
Neutrophils Absolute: 4.8 10*3/uL (ref 1.4–7.0)
Neutrophils: 58 %
Platelets: 196 10*3/uL (ref 150–450)
RBC: 5.36 x10E6/uL — ABNORMAL HIGH (ref 3.77–5.28)
RDW: 13 % (ref 11.7–15.4)
WBC: 8.2 10*3/uL (ref 3.4–10.8)

## 2021-02-09 LAB — LIPID PANEL
Chol/HDL Ratio: 4.3 ratio (ref 0.0–4.4)
Cholesterol, Total: 234 mg/dL — ABNORMAL HIGH (ref 100–199)
HDL: 54 mg/dL (ref >39)
LDL Chol Calc (NIH): 150 mg/dL — ABNORMAL HIGH (ref 0–99)
Triglycerides: 169 mg/dL — ABNORMAL HIGH (ref 0–149)
VLDL Cholesterol Cal: 30 mg/dL (ref 5–40)

## 2021-02-09 LAB — TSH: TSH: 1.37 u[IU]/mL (ref 0.450–4.500)

## 2021-02-11 ENCOUNTER — Other Ambulatory Visit: Payer: Self-pay | Admitting: Family

## 2021-02-11 MED ORDER — ROSUVASTATIN CALCIUM 5 MG PO TABS: 5.0000 mg | ORAL_TABLET | Freq: Every day | ORAL | 3 refills | Status: DC

## 2021-07-25 ENCOUNTER — Ambulatory Visit (INDEPENDENT_AMBULATORY_CARE_PROVIDER_SITE_OTHER): Payer: Medicare Other

## 2021-07-25 VITALS — Wt 247.0 lb

## 2021-07-25 DIAGNOSIS — Z0000 Encounter for general adult medical examination without abnormal findings: Secondary | ICD-10-CM | POA: Diagnosis not present

## 2021-07-25 NOTE — Progress Notes (Signed)
Subjective:   Sierra Greer is a 71 y.o. female who presents for Medicare Annual (Subsequent) preventive examination.  Virtual Visit via Telephone Note  I connected with  Sierra Greer on 07/25/21 at  2:45 PM EDT by telephone and verified that I am speaking with the correct person using two identifiers.  Location: Patient: Home Provider: [facility] Persons participating in the virtual visit: patient/Nurse Health Advisor   I discussed the limitations, risks, security and privacy concerns of performing an evaluation and management service by telephone and the availability of in person appointments. The patient expressed understanding and agreed to proceed.  Interactive audio and video telecommunications were attempted between this nurse and patient, however failed, due to patient having technical difficulties OR patient did not have access to video capability.  We continued and completed visit with audio only.  Some vital signs may be absent or patient reported.   Nilani Hugill E Eathan Groman, LPN   Review of Systems     Cardiac Risk Factors include: advanced age (>18men, >76 women);obesity (BMI >30kg/m2);sedentary lifestyle;dyslipidemia     Objective:    Today's Vitals   07/25/21 1441  Weight: 247 lb (112 kg)  PainSc: 6    Body mass index is 42.4 kg/m.     07/25/2021    2:51 PM 07/24/2020    2:07 PM 04/30/2020    7:53 AM 09/14/2018   10:09 AM 05/13/2015    3:26 PM  Advanced Directives  Does Patient Have a Medical Advance Directive? No No No No No  Would patient like information on creating a medical advance directive? Yes (MAU/Ambulatory/Procedural Areas - Information given) Yes (MAU/Ambulatory/Procedural Areas - Information given)  No - Patient declined No - patient declined information    Current Medications (verified) Outpatient Encounter Medications as of 07/25/2021  Medication Sig   carboxymethylcellul-glycerin (REFRESH OPTIVE) 0.5-0.9 % ophthalmic solution 1 drop.   diphenhydrAMINE  (BENADRYL) 25 MG tablet Take 50 mg by mouth every 6 (six) hours as needed (Swelling, breathing).   omeprazole (PRILOSEC) 20 MG capsule Take 20 mg by mouth daily.   rosuvastatin (CRESTOR) 5 MG tablet Take 1 tablet (5 mg total) by mouth daily. (Patient not taking: Reported on 07/25/2021)   [DISCONTINUED] methocarbamol (ROBAXIN) 500 MG tablet Take 1 tablet (500 mg total) by mouth every 8 (eight) hours as needed for muscle spasms. (Patient not taking: Reported on 07/25/2021)   No facility-administered encounter medications on file as of 07/25/2021.    Allergies (verified) Penicillins   History: Past Medical History:  Diagnosis Date   Diverticulitis    GERD (gastroesophageal reflux disease)    Hyperlipidemia    Leaky heart valve    Past Surgical History:  Procedure Laterality Date   ABDOMINAL HYSTERECTOMY     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  Tobacco Use   Smoking status: Former    Packs/day: 0.50    Types: Cigarettes    Start date: 02/09/1994    Quit date: 01/21/2002    Years since quitting: 19.5   Smokeless tobacco: Never  Vaping Use  Vaping Use: Never used  Substance and Sexual Activity   Alcohol use: No   Drug use: No   Sexual activity: Not Currently    Birth control/protection: Surgical  Other Topics Concern   Not on file  Social History Narrative   Lives alone in a travel trailer in daughter's back yard - severe pain in knees and sometimes shoulders due to arthritis - uses 2 canes to help take stress off of knees while walking    Social Determinants of Health   Financial Resource Strain: Low Risk     Difficulty of Paying Living Expenses: Not hard at all  Food Insecurity: No Food Insecurity   Worried About Programme researcher, broadcasting/film/video in the Last Year: Never true   Barista in the Last Year: Never true  Transportation Needs: No Transportation Needs   Lack of Transportation (Medical): No   Lack of Transportation (Non-Medical): No  Physical Activity: Insufficiently Active   Days of Exercise per Week: 7 days   Minutes of Exercise per Session: 20 min  Stress: No Stress Concern Present   Feeling of Stress : Not at all  Social Connections: Socially Isolated   Frequency of Communication with Friends and Family: More than three times a week   Frequency of Social Gatherings with Friends and Family: More than three times a week   Attends Religious Services: Never   Database administrator or Organizations: No   Attends Engineer, structural: Never   Marital Status: Divorced    Tobacco Counseling Counseling given: Not Answered   Clinical Intake:  Pre-visit preparation completed: Yes  Pain : 0-10 Pain Score: 6  Pain Type: Chronic pain Pain Location: Knee Pain Orientation: Right, Left Pain Descriptors / Indicators: Aching, Sore Pain Onset: More than a month ago     BMI - recorded: 42.4 Nutritional Status: BMI > 30  Obese Nutritional Risks: None Diabetes: No  How often do you need to have someone help you when you read instructions, pamphlets, or other written materials from your doctor or pharmacy?: 1 - Never  Diabetic? no  Interpreter Needed?: No  Information entered by :: Corin Formisano, LPN   Activities of Daily Living    07/25/2021    2:51 PM  In your present state of health, do you have any difficulty performing the following activities:  Hearing? 0  Vision? 0  Difficulty concentrating or making decisions? 0  Walking or climbing stairs? 1  Comment hurts knees  Dressing or bathing? 0  Doing errands, shopping? 0  Preparing Food and eating ? N  Using the  Toilet? N  In the past six months, have you accidently leaked urine? Y  Comment mild intermittent  Do you have problems with loss of bowel control? N  Managing your Medications? N  Managing your Finances? N  Housekeeping or managing your Housekeeping? N    Patient Care Team: Junie Spencer, FNP as PCP - General (Family Medicine) Moody Bruins as Physician Assistant (Chiropractic Medicine) Michaelle Copas, MD as Referring Physician (Optometry)  Indicate any recent Medical Services you may have received from other than [facility] providers in the past year (date may be approximate).     Assessment:   This is a routine wellness examination for Sierra Greer.  Hearing/Vision screen Hearing Screening - Comments:: Denies hearing difficulties   Vision Screening - Comments:: Wears rx glasses - up to date with routine eye exams with [facility] in [place]  Dietary issues and exercise activities discussed: Current Exercise Habits: Home exercise routine, Type of exercise: walking;stretching, Time (Minutes): 15, Frequency (Times/Week): 7, Weekly Exercise (Minutes/Week): 105, Intensity: Mild, Exercise limited by: orthopedic condition(s)   Goals Addressed             This Visit's Progress    DIET - INCREASE WATER INTAKE   On track    Try to drink 6-8 glasses of water daily.     Exercise 3x per week (30 min per time)   Not on track    Low fat, low carb diet - Try to lose 1-2 lbs per week and be more active.       Depression Screen    07/25/2021    2:48 PM 02/08/2021   11:22 AM 07/24/2020    1:57 PM 05/03/2020    2:52 PM 09/14/2018   10:09 AM 12/26/2015    5:32 PM  PHQ 2/9 Scores  PHQ - 2 Score 0 0 0 0 0 1    Fall Risk    07/25/2021    2:43 PM 07/24/2020    2:07 PM 05/03/2020    2:38 PM 09/14/2018   10:09 AM 12/26/2015    5:32 PM  Fall Risk   Falls in the past year? 0 0 0 0 No  Number falls in past yr: 0 0     Injury with Fall? 0 0     Risk for fall due to : Impaired  balance/gait;Orthopedic patient Orthopedic patient;Impaired balance/gait;Impaired vision     Follow up Education provided;Falls prevention discussed Education provided;Falls prevention discussed       FALL RISK PREVENTION PERTAINING TO THE HOME:  Any stairs in or around the home? Yes  If so, are there any without handrails? No  Home free of loose throw rugs in walkways, pet beds, electrical cords, etc? Yes  Adequate lighting in your home to reduce risk of falls? Yes   ASSISTIVE DEVICES UTILIZED TO PREVENT FALLS:  Life alert? No  Use of a cane, walker or w/c? Yes  Grab bars in the bathroom? No  Shower chair or bench in shower? No  Elevated toilet seat or a handicapped toilet? No   TIMED UP AND GO:  Was the test performed? No . Telephonic visit  Cognitive Function:        07/25/2021    2:51 PM 09/14/2018   10:12 AM  6CIT Screen  What Year? 0 points 0 points  What month? 0 points 0 points  What time? 0 points 0 points  Count back from 20 0 points 0 points  Months in reverse 0 points 0 points  Repeat phrase 4 points 0 points  Total Score 4 points 0 points    Immunizations Immunization History  Administered Date(s) Administered   Fluad Quad(high Dose 65+) 05/03/2020, 02/08/2021   Moderna Sars-Covid-2 Vaccination 06/02/2019, 07/05/2019, 02/25/2020   PNEUMOCOCCAL CONJUGATE-20 02/08/2021   Pneumococcal Polysaccharide-23 03/30/2018    TDAP status: Due, Education has been provided regarding the importance of this vaccine. Advised may receive this vaccine at local pharmacy or Health Dept. Aware to provide a copy of the vaccination record if obtained from local pharmacy or Health Dept. Verbalized acceptance and understanding.  Flu Vaccine status: Up to date  Pneumococcal vaccine status: Up to date  Covid-19 vaccine status: Completed vaccines  Qualifies for Shingles Vaccine? Yes   Zostavax completed No   Shingrix Completed?: No.    Education has been provided regarding the  importance of this  vaccine. Patient has been advised to call insurance company to determine out of pocket expense if they have not yet received this vaccine. Advised may also receive vaccine at local pharmacy or Health Dept. Verbalized acceptance and understanding.  Screening Tests Health Maintenance  Topic Date Due   TETANUS/TDAP  Never done   COLONOSCOPY (Pts 45-15yrs Insurance coverage will need to be confirmed)  Never done   MAMMOGRAM  Never done   Zoster Vaccines- Shingrix (1 of 2) Never done   DEXA SCAN  Never done   COVID-19 Vaccine (4 - Booster for Moderna series) 04/21/2020   INFLUENZA VACCINE  10/08/2021   Pneumonia Vaccine 47+ Years old  Completed   Hepatitis C Screening  Completed   HPV VACCINES  Aged Out    Health Maintenance  Health Maintenance Due  Topic Date Due   TETANUS/TDAP  Never done   COLONOSCOPY (Pts 45-15yrs Insurance coverage will need to be confirmed)  Never done   MAMMOGRAM  Never done   Zoster Vaccines- Shingrix (1 of 2) Never done   DEXA SCAN  Never done   COVID-19 Vaccine (4 - Booster for Moderna series) 04/21/2020   This patient declines Mammogram, colonoscopy, and DEXA  Lung Cancer Screening: (Low Dose CT Chest recommended if Age 61-80 years, 30 pack-year currently smoking OR have quit w/in 15years.) does not qualify  Additional Screening:  Hepatitis C Screening: does qualify; Completed 03/30/2018  Vision Screening: Recommended annual ophthalmology exams for early detection of glaucoma and other disorders of the eye. Is the patient up to date with their annual eye exam?  Yes  Who is the provider or what is the name of the office in which the patient attends annual eye exams? [facility] in [place] If pt is not established with a provider, would they like to be referred to a provider to establish care? No .   Dental Screening: Recommended annual dental exams for proper oral hygiene  Community Resource Referral / Chronic Care Management: CRR  required this visit?  No   CCM required this visit?  No      Plan:     I have personally reviewed and noted the following in the patient's chart:   Medical and social history Use of alcohol, tobacco or illicit drugs  Current medications and supplements including opioid prescriptions.  Functional ability and status Nutritional status Physical activity Advanced directives List of other physicians Hospitalizations, surgeries, and ER visits in previous 12 months Vitals Screenings to include cognitive, depression, and falls Referrals and appointments  In addition, I have reviewed and discussed with patient certain preventive protocols, quality metrics, and best practice recommendations. A written personalized care plan for preventive services as well as general preventive health recommendations were provided to patient.     [place] Constable, LPN   06/05/9240   Nurse Notes: She stopped taking Rosuvastatin for about a month because she thought it may be causing palpitations; started it back and for the 2-3 days she took it, her stomach was upset. Unsure if related, but she doesn't want to take it anymore.

## 2021-07-25 NOTE — Patient Instructions (Signed)
Sierra Greer , Thank you for taking time to come for your Medicare Wellness Visit. I appreciate your ongoing commitment to your health goals. Please review the following plan we discussed and let me know if I can assist you in the future.   Screening recommendations/referrals: Colonoscopy: Declined Mammogram: Declined - recommended annually Bone Density: Declined - recommended every 2 years Recommended yearly ophthalmology/optometry visit for glaucoma screening and checkup Recommended yearly dental visit for hygiene and checkup  Vaccinations: Influenza vaccine: Done 02/08/2021 - Repeat annually  Pneumococcal vaccine: Done  03/30/2018 & 02/08/2021 Tdap vaccine: Due - recommended every 10 years Shingles vaccine: Due - Shingrix is 2 doses 2-6 months apart and over 90% effective     Covid-19: Done 06/02/2019, 07/05/2019, 02/25/2020  Advanced directives: Please bring a copy of your health care power of attorney and living will to the office to be added to your chart at your convenience.   Conditions/risks identified: Aim for 30 minutes of exercise or brisk walking, 6-8 glasses of water, and 5 servings of fruits and vegetables each day.   Next appointment: Follow up in one year for your annual wellness visit    Preventive Care 65 Years and Older, Female Preventive care refers to lifestyle choices and visits with your health care provider that can promote health and wellness. What does preventive care include? A yearly physical exam. This is also called an annual well check. Dental exams once or twice a year. Routine eye exams. Ask your health care provider how often you should have your eyes checked. Personal lifestyle choices, including: Daily care of your teeth and gums. Regular physical activity. Eating a healthy diet. Avoiding tobacco and drug use. Limiting alcohol use. Practicing safe sex. Taking low-dose aspirin every day. Taking vitamin and mineral supplements as recommended by your  health care provider. What happens during an annual well check? The services and screenings done by your health care provider during your annual well check will depend on your age, overall health, lifestyle risk factors, and family history of disease. Counseling  Your health care provider may ask you questions about your: Alcohol use. Tobacco use. Drug use. Emotional well-being. Home and relationship well-being. Sexual activity. Eating habits. History of falls. Memory and ability to understand (cognition). Work and work Astronomer. Reproductive health. Screening  You may have the following tests or measurements: Height, weight, and BMI. Blood pressure. Lipid and cholesterol levels. These may be checked every 5 years, or more frequently if you are over 24 years old. Skin check. Lung cancer screening. You may have this screening every year starting at age 1 if you have a 30-pack-year history of smoking and currently smoke or have quit within the past 15 years. Fecal occult blood test (FOBT) of the stool. You may have this test every year starting at age 64. Flexible sigmoidoscopy or colonoscopy. You may have a sigmoidoscopy every 5 years or a colonoscopy every 10 years starting at age 48. Hepatitis C blood test. Hepatitis B blood test. Sexually transmitted disease (STD) testing. Diabetes screening. This is done by checking your blood sugar (glucose) after you have not eaten for a while (fasting). You may have this done every 1-3 years. Bone density scan. This is done to screen for osteoporosis. You may have this done starting at age 94. Mammogram. This may be done every 1-2 years. Talk to your health care provider about how often you should have regular mammograms. Talk with your health care provider about your test results, treatment options,  and if necessary, the need for more tests. Vaccines  Your health care provider may recommend certain vaccines, such as: Influenza vaccine.  This is recommended every year. Tetanus, diphtheria, and acellular pertussis (Tdap, Td) vaccine. You may need a Td booster every 10 years. Zoster vaccine. You may need this after age 44. Pneumococcal 13-valent conjugate (PCV13) vaccine. One dose is recommended after age 24. Pneumococcal polysaccharide (PPSV23) vaccine. One dose is recommended after age 42. Talk to your health care provider about which screenings and vaccines you need and how often you need them. This information is not intended to replace advice given to you by your health care provider. Make sure you discuss any questions you have with your health care provider. Document Released: 03/23/2015 Document Revised: 11/14/2015 Document Reviewed: 12/26/2014 Elsevier Interactive Patient Education  2017 [place] Prevention in the Home Falls can cause injuries. They can happen to people of all ages. There are many things you can do to make your home safe and to help prevent falls. What can I do on the outside of my home? Regularly fix the edges of walkways and driveways and fix any cracks. Remove anything that might make you trip as you walk through a door, such as a raised step or threshold. Trim any bushes or trees on the path to your home. Use bright outdoor lighting. Clear any walking paths of anything that might make someone trip, such as rocks or tools. Regularly check to see if handrails are loose or broken. Make sure that both sides of any steps have handrails. Any raised decks and porches should have guardrails on the edges. Have any leaves, snow, or ice cleared regularly. Use sand or salt on walking paths during winter. Clean up any spills in your garage right away. This includes oil or grease spills. What can I do in the bathroom? Use night lights. Install grab bars by the toilet and in the tub and shower. Do not use towel bars as grab bars. Use non-skid mats or decals in the tub or shower. If you need to sit  down in the shower, use a plastic, non-slip stool. Keep the floor dry. Clean up any water that spills on the floor as soon as it happens. Remove soap buildup in the tub or shower regularly. Attach bath mats securely with double-sided non-slip rug tape. Do not have throw rugs and other things on the floor that can make you trip. What can I do in the bedroom? Use night lights. Make sure that you have a light by your bed that is easy to reach. Do not use any sheets or blankets that are too big for your bed. They should not hang down onto the floor. Have a firm chair that has side arms. You can use this for support while you get dressed. Do not have throw rugs and other things on the floor that can make you trip. What can I do in the kitchen? Clean up any spills right away. Avoid walking on wet floors. Keep items that you use a lot in easy-to-reach places. If you need to reach something above you, use a strong step stool that has a grab bar. Keep electrical cords out of the way. Do not use floor polish or wax that makes floors slippery. If you must use wax, use non-skid floor wax. Do not have throw rugs and other things on the floor that can make you trip. What can I do with my stairs? Do not leave  any items on the stairs. Make sure that there are handrails on both sides of the stairs and use them. Fix handrails that are broken or loose. Make sure that handrails are as long as the stairways. Check any carpeting to make sure that it is firmly attached to the stairs. Fix any carpet that is loose or worn. Avoid having throw rugs at the top or bottom of the stairs. If you do have throw rugs, attach them to the floor with carpet tape. Make sure that you have a light switch at the top of the stairs and the bottom of the stairs. If you do not have them, ask someone to add them for you. What else can I do to help prevent falls? Wear shoes that: Do not have high heels. Have rubber bottoms. Are  comfortable and fit you well. Are closed at the toe. Do not wear sandals. If you use a stepladder: Make sure that it is fully opened. Do not climb a closed stepladder. Make sure that both sides of the stepladder are locked into place. Ask someone to hold it for you, if possible. Clearly mark and make sure that you can see: Any grab bars or handrails. First and last steps. Where the edge of each step is. Use tools that help you move around (mobility aids) if they are needed. These include: Canes. Walkers. Scooters. Crutches. Turn on the lights when you go into a dark area. Replace any light bulbs as soon as they burn out. Set up your furniture so you have a clear path. Avoid moving your furniture around. If any of your floors are uneven, fix them. If there are any pets around you, be aware of where they are. Review your medicines with your doctor. Some medicines can make you feel dizzy. This can increase your chance of falling. Ask your doctor what other things that you can do to help prevent falls. This information is not intended to replace advice given to you by your health care provider. Make sure you discuss any questions you have with your health care provider. Document Released: 12/21/2008 Document Revised: 08/02/2015 Document Reviewed: 03/31/2014 Elsevier Interactive Patient Education  2017 [employer].

## 2021-09-13 ENCOUNTER — Encounter: Payer: Self-pay | Admitting: Family

## 2021-09-13 ENCOUNTER — Ambulatory Visit: Payer: Medicare Other | Admitting: Family

## 2022-05-13 IMAGING — CT CT CERVICAL SPINE W/O CM
3 of 4 series · 12 of 33 positions shown, 14 images · non-contrast
Comparison: None.

CLINICAL DATA: Neck pain without injury.

EXAM:
CT CERVICAL SPINE WITHOUT CONTRAST
TECHNIQUE: Multidetector CT imaging of the cervical spine was performed without
intravenous contrast. Multiplanar CT image reconstructions were also
generated.

[Series 5: sag bone · sagittal · 0.26mm/px · 5 of 61 slices shown, 6 images]
[im 21/61  bone]
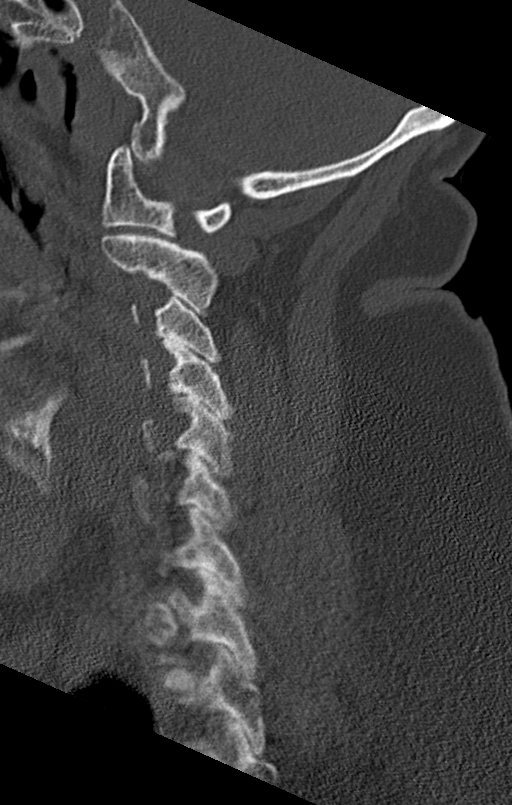
[im 26/61  bone]
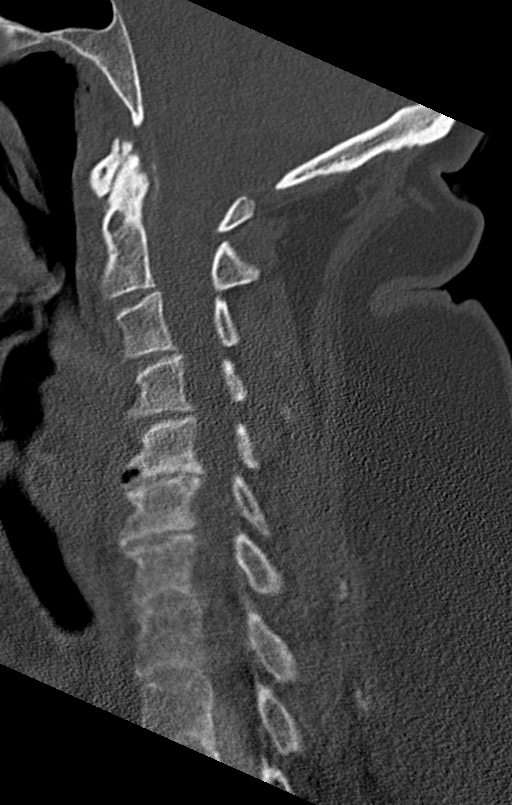
[im 31/61  soft-tissue]
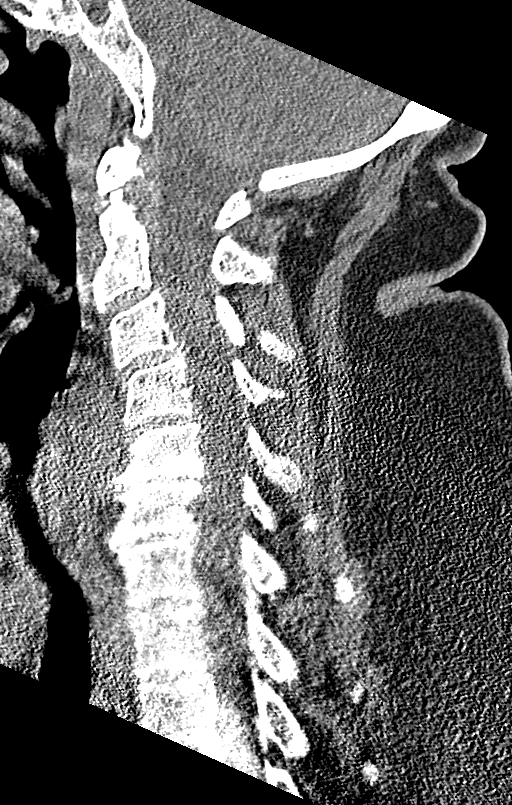
[im 31/61  bone]
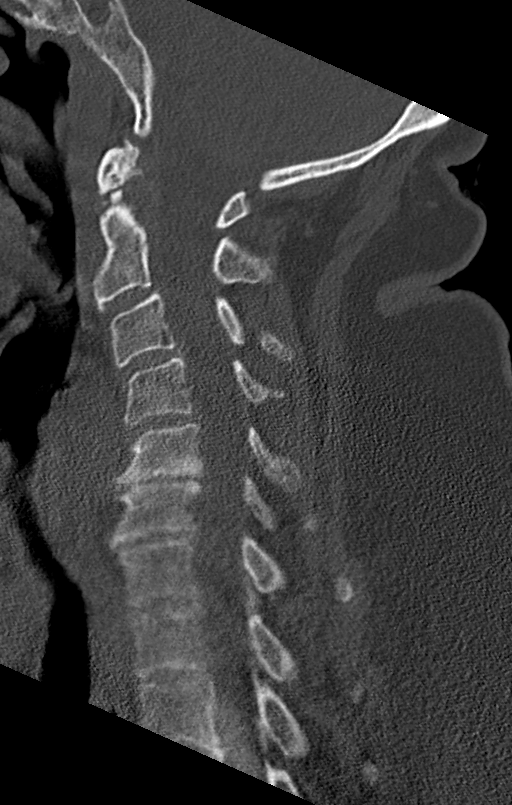
[im 36/61  bone]
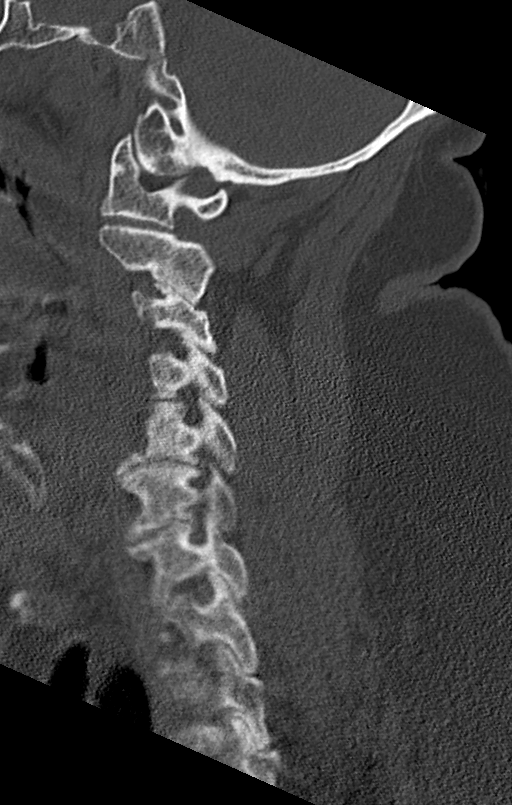
[im 41/61  bone]
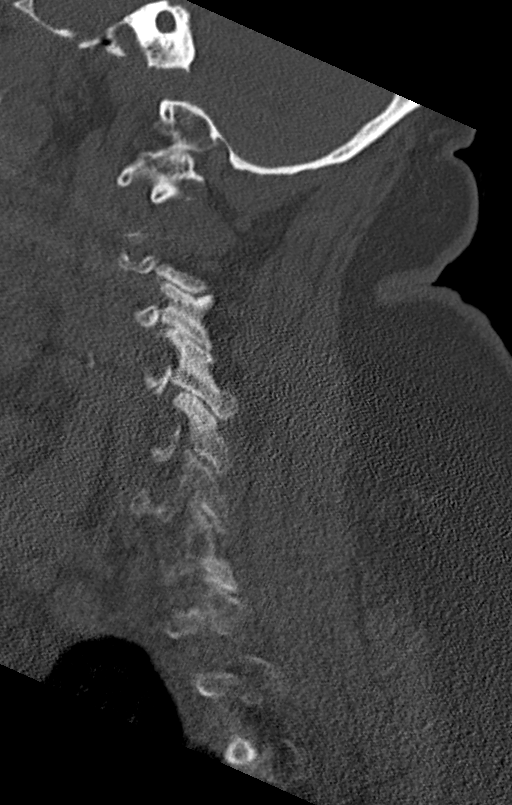

[Series 6: cor bone · coronal · 0.22mm/px · 3 of 61 slices shown]
[im 13/61  bone]
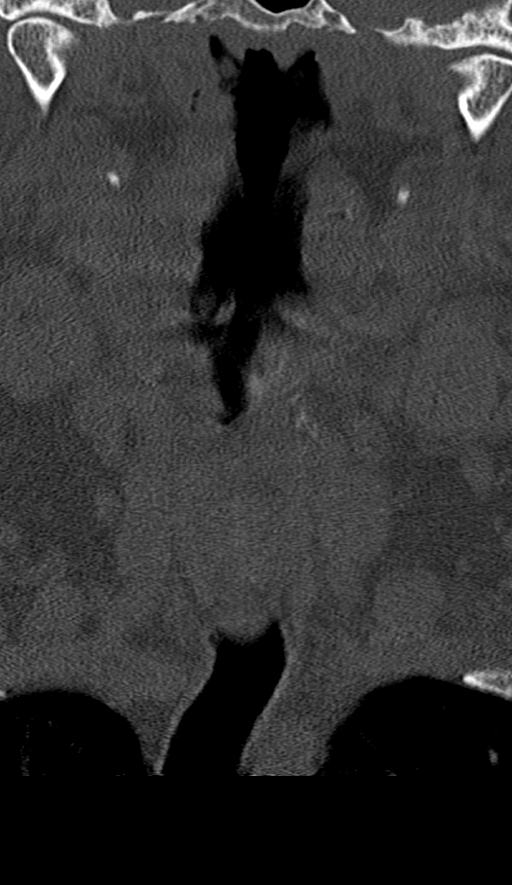
[im 25/61  bone]
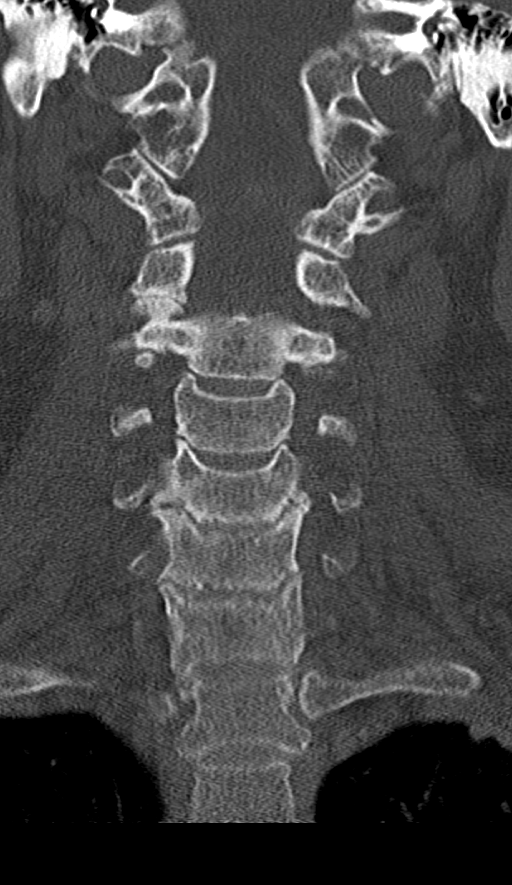
[im 37/61  bone]
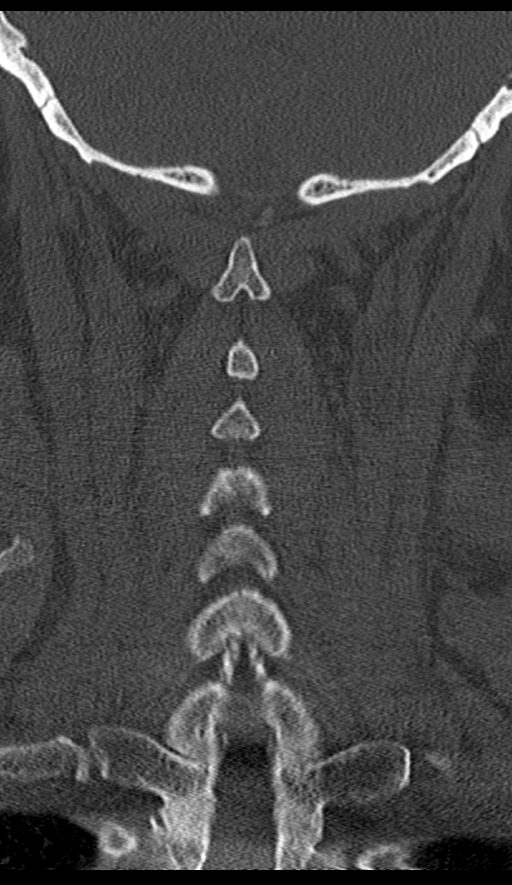

[Series 7: orthogonal axials · axial · 0.21mm/px · z∈[+302,+401]mm · 4 of 87 slices shown, 5 images]
[im 15/87  soft-tissue]
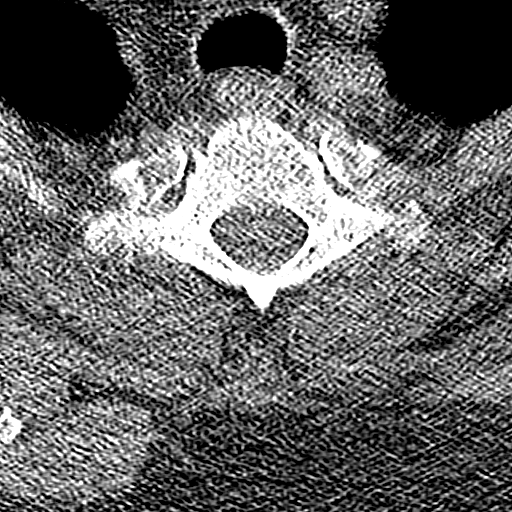
[im 15/87  bone]
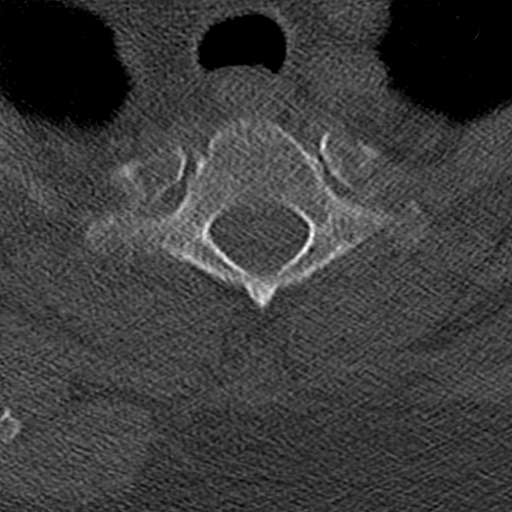
[im 29/87  bone]
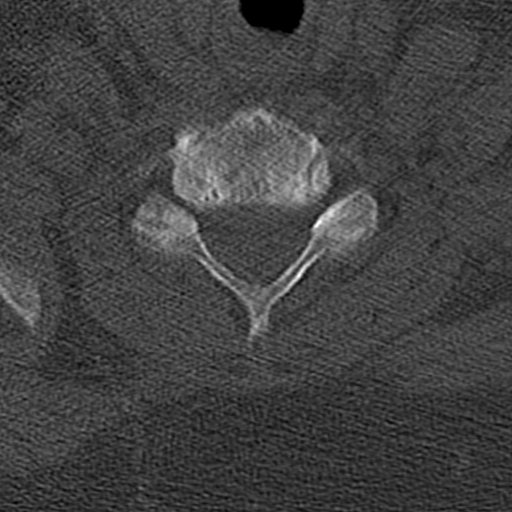
[im 58/87  bone]
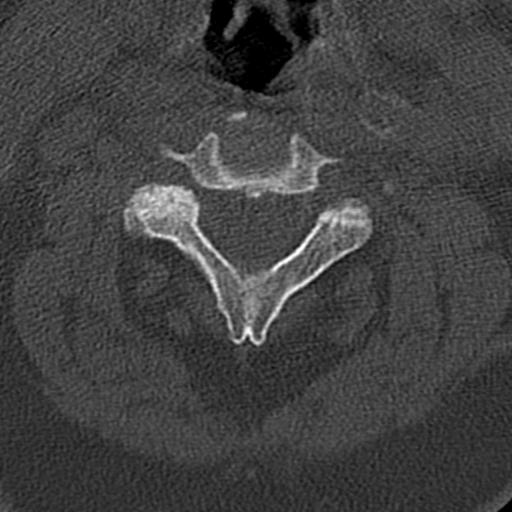
[im 72/87  bone]
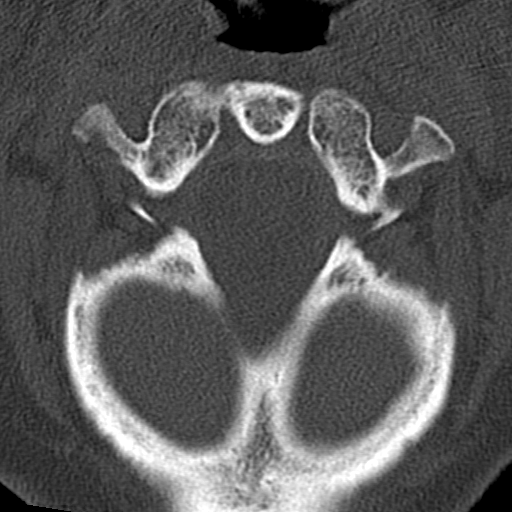

[12 of 33 positions shown; findings below may reference images not displayed]

FINDINGS: Alignment: Minimal grade 1 anterolisthesis of C3-4 and C4-5 is noted
secondary to posterior facet joint hypertrophy.

Skull base and vertebrae: No acute fracture. No primary bone lesion
or focal pathologic process.

Soft tissues and spinal canal: No prevertebral fluid or swelling. No
visible canal hematoma.

Disc levels: Severe degenerative disc disease is noted at C5-6 and
C6-7 with anterior posterior osteophyte formation.

Upper chest: Negative.

Other: Degenerative changes are seen involving posterior facet
joints bilaterally.
IMPRESSION: Severe multilevel degenerative disc disease. No acute abnormality
seen in the cervical spine.

## 2022-05-13 IMAGING — DX DG CHEST 2V
2 series · 2 of 2 positions shown · non-contrast
Comparison: January 16, 2013.

CLINICAL DATA: Right-sided chest pain.

EXAM:
CHEST - 2 VIEW

[chest pa]
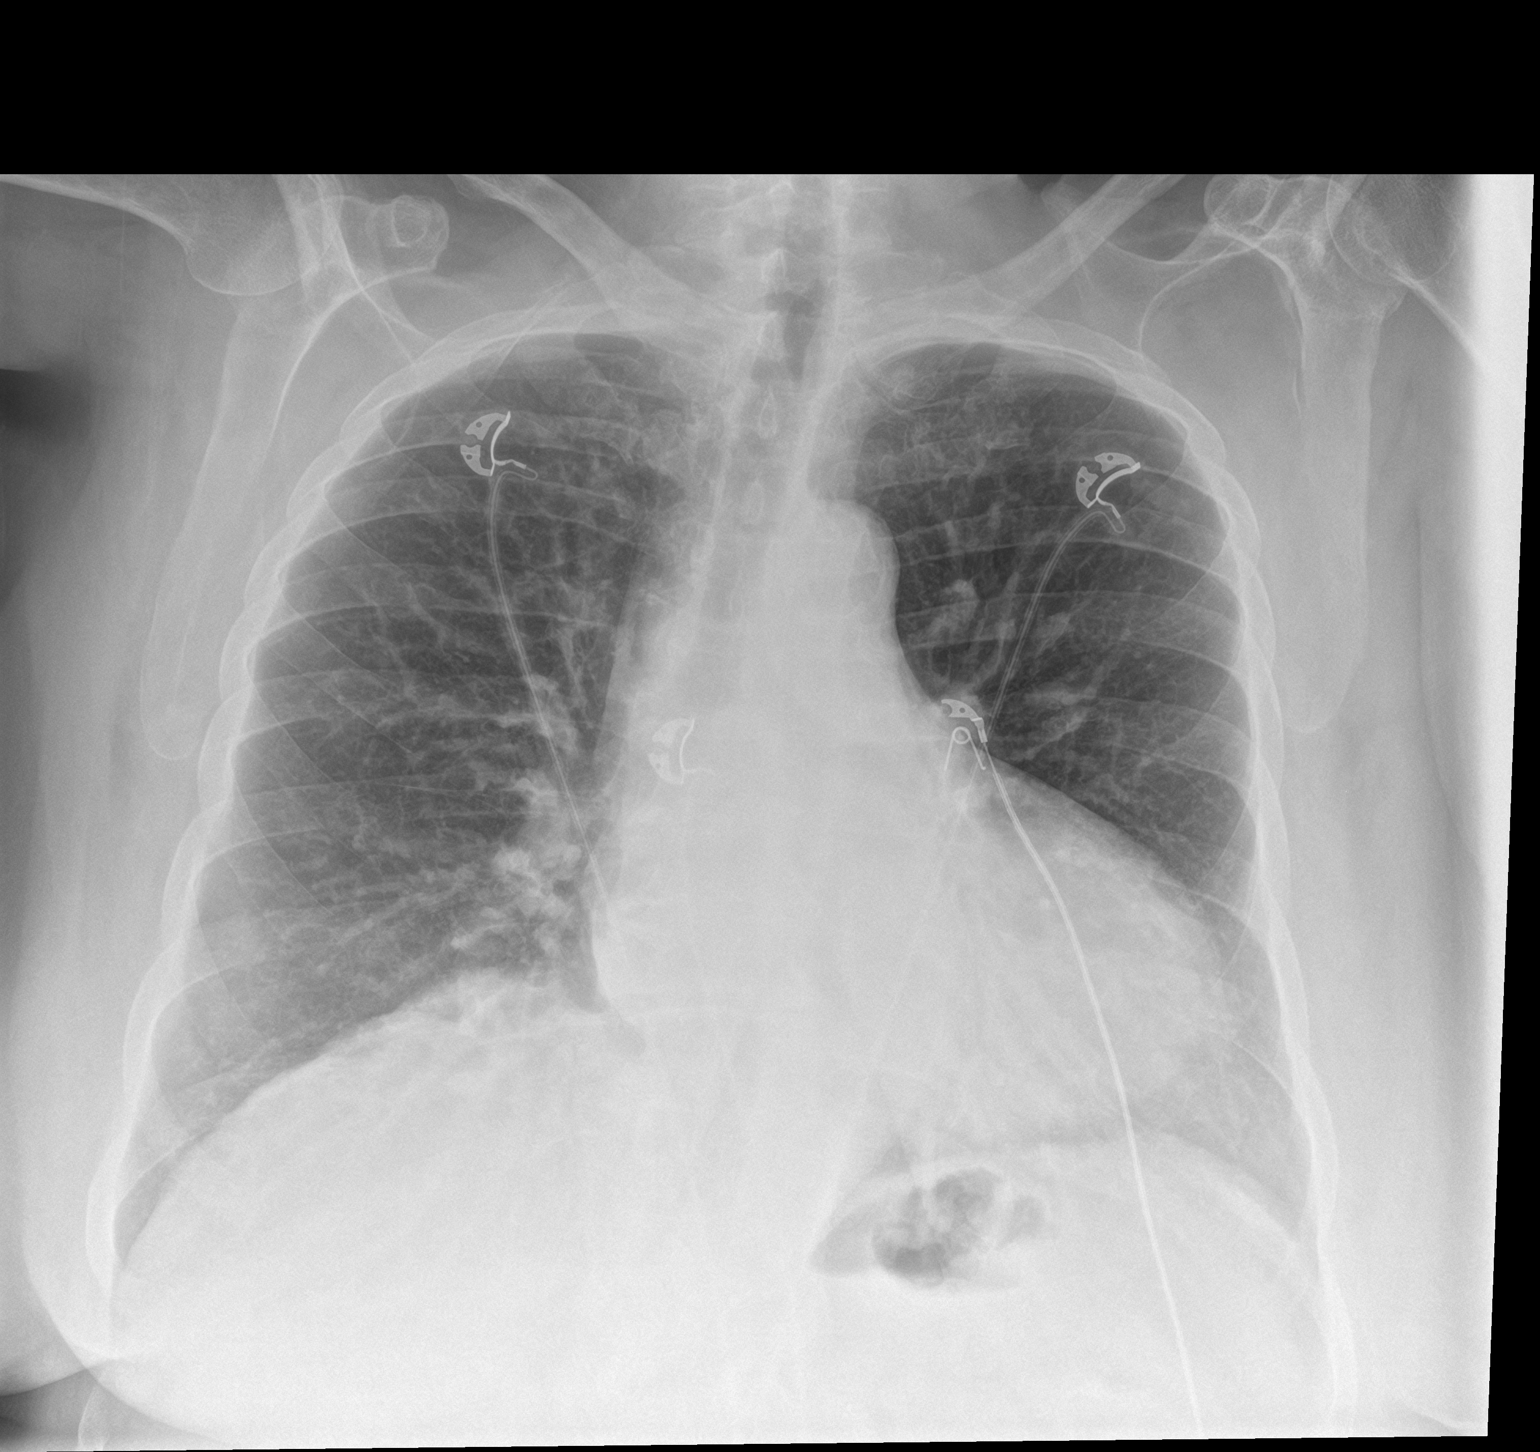

[chest lat]
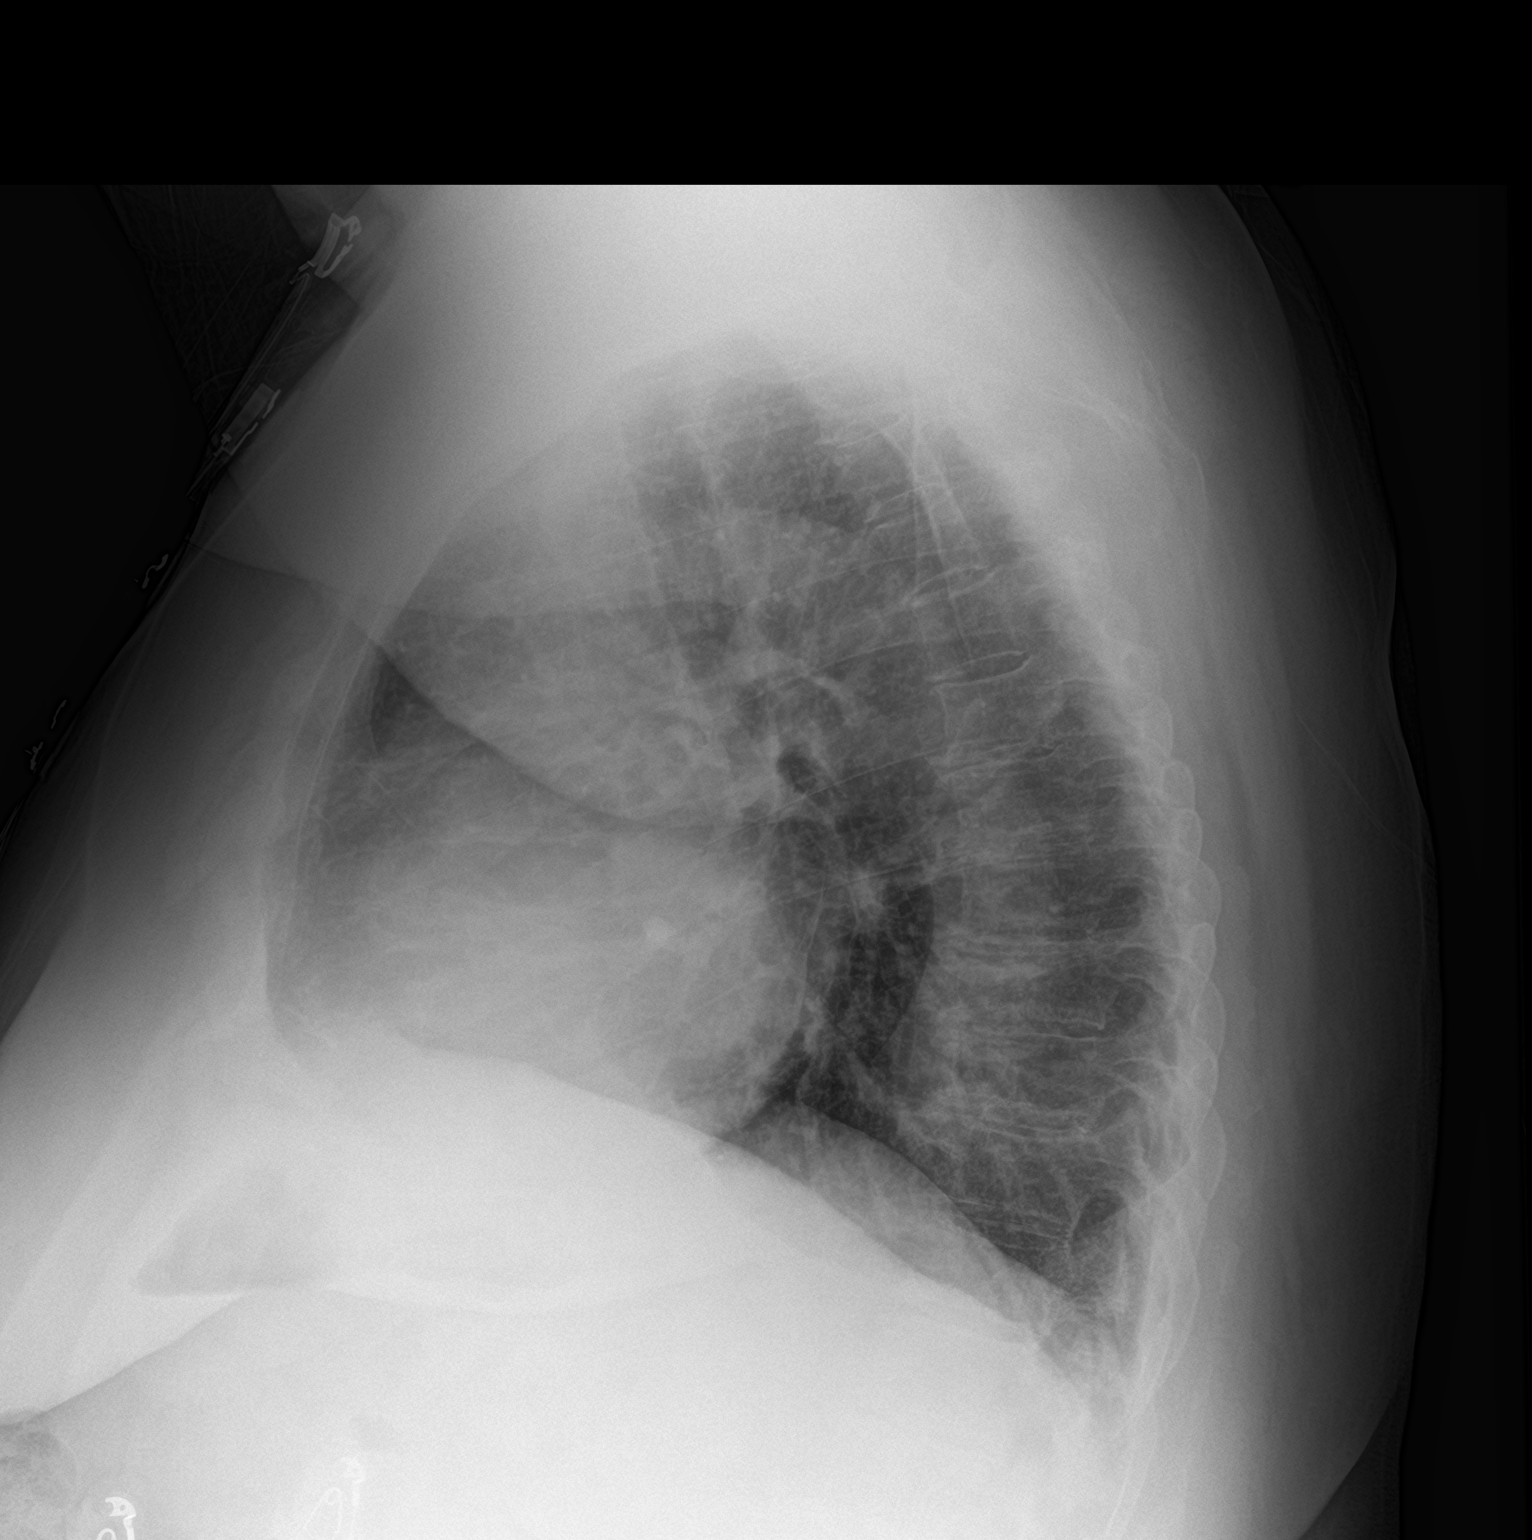

[2 of 2 positions shown; findings below may reference images not displayed]

FINDINGS: The heart size and mediastinal contours are within normal limits.
Both lungs are clear. No pneumothorax or pleural effusion is noted.
The visualized skeletal structures are unremarkable.
IMPRESSION: No active cardiopulmonary disease.

## 2022-05-13 IMAGING — DX DG SHOULDER 2+V*R*
2 series · 2 of 2 positions shown · non-contrast
Comparison: None.

CLINICAL DATA: Acute right shoulder pain without known injury.

EXAM:
RIGHT SHOULDER - 2+ VIEW

[shoulder grashey]
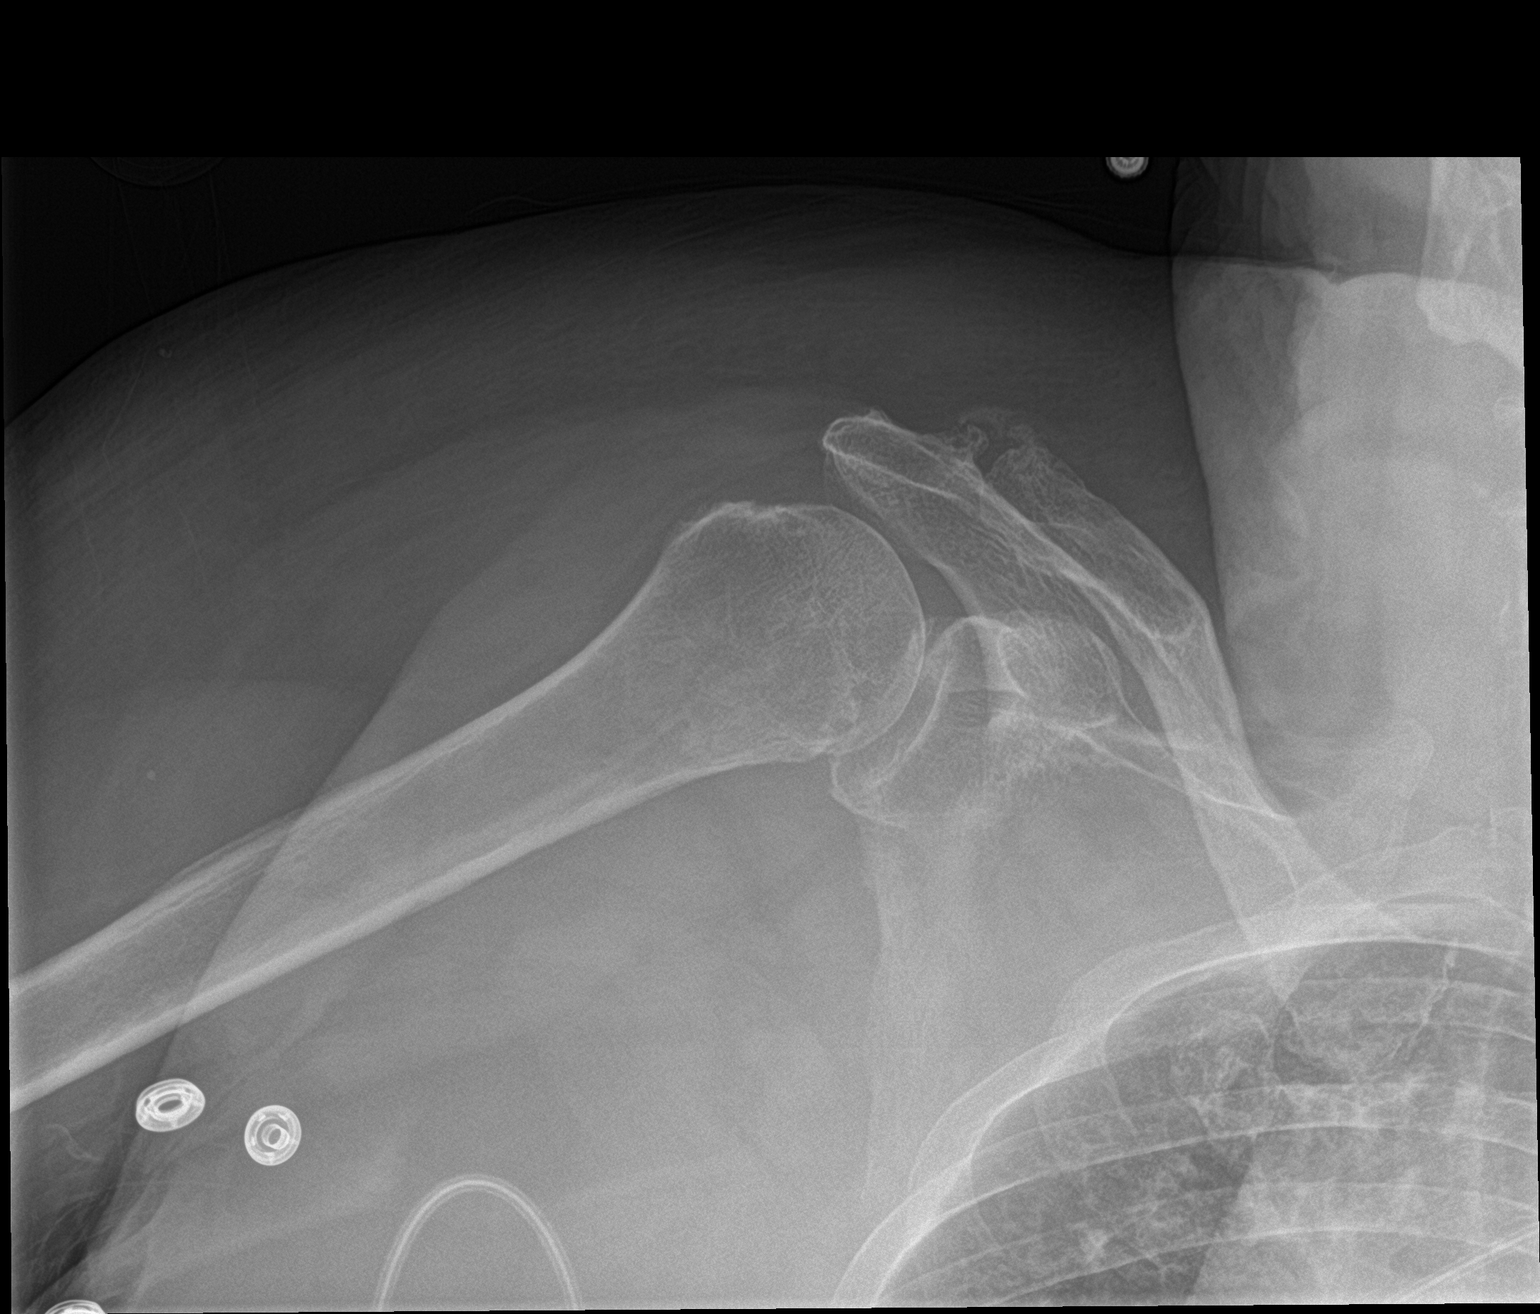

[shoulder y view]
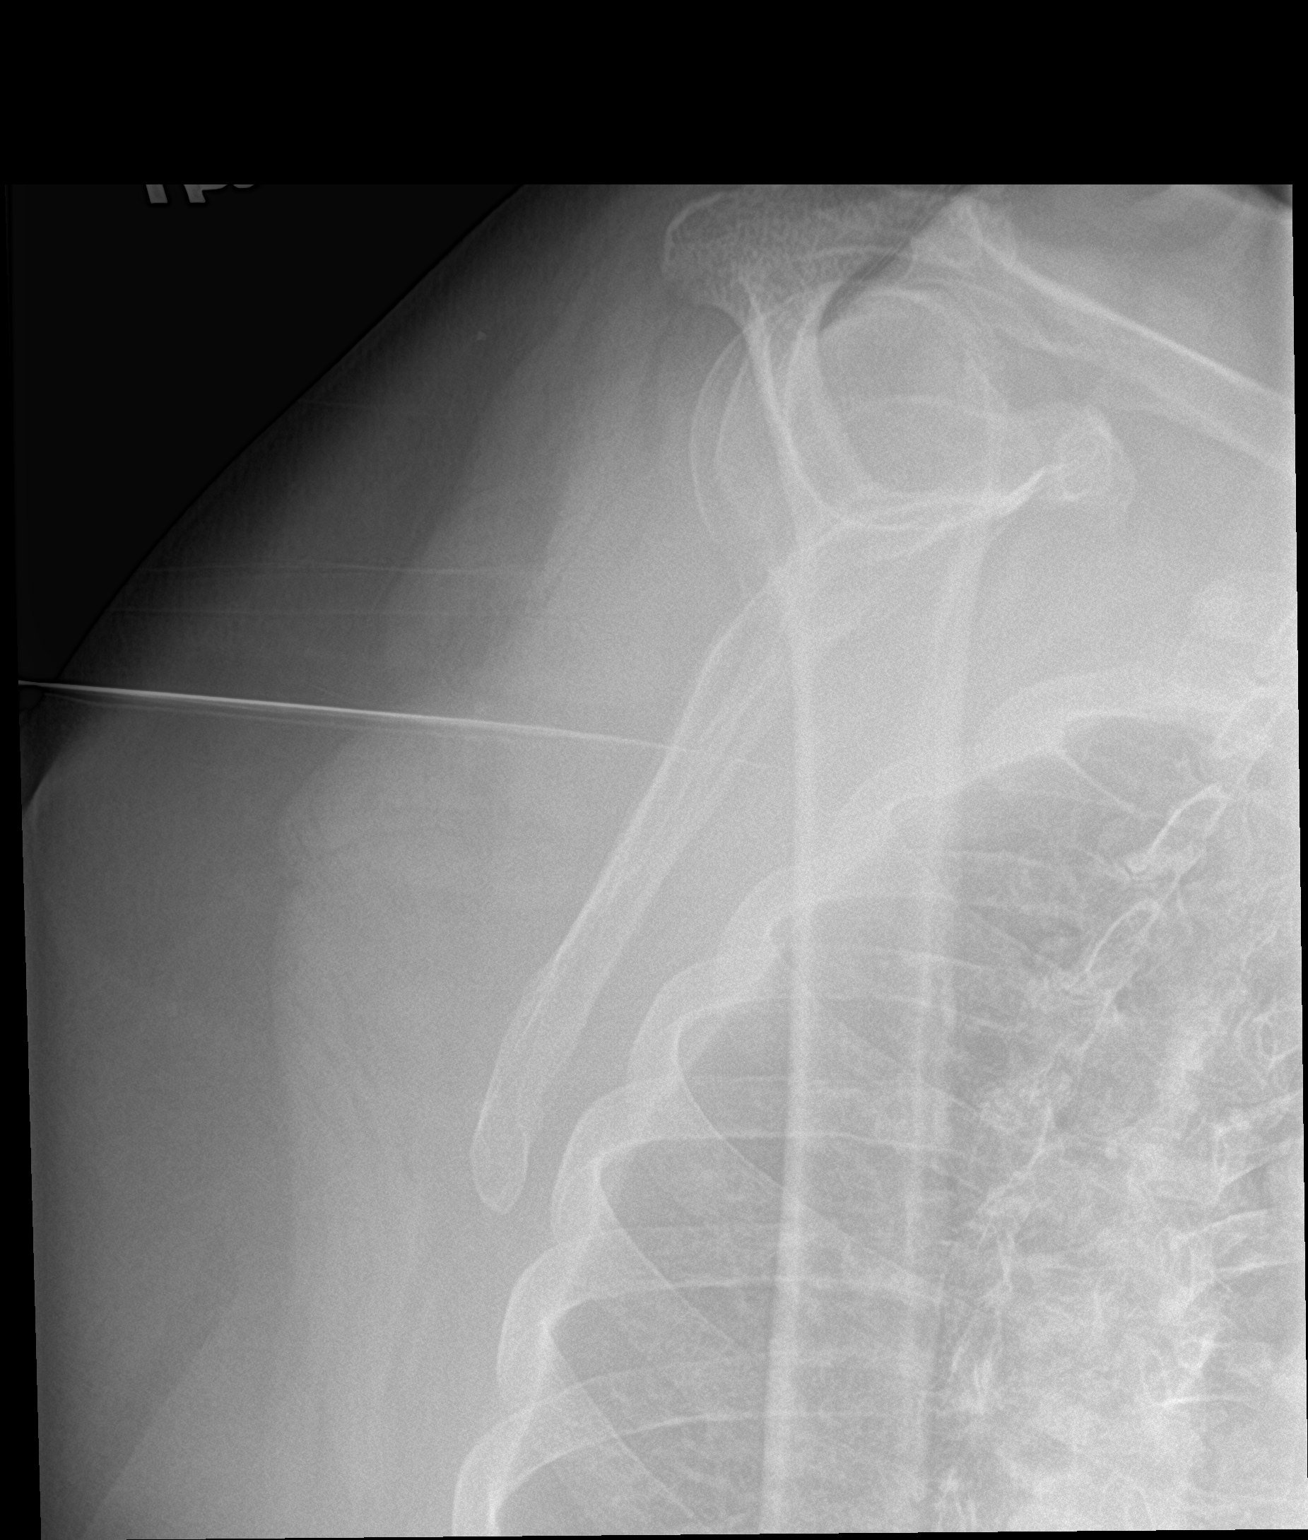

[2 of 2 positions shown; findings below may reference images not displayed]

FINDINGS: There is no evidence of fracture or dislocation. Moderate
degenerative changes seen involving the right acromioclavicular
joint. Soft tissues are unremarkable.
IMPRESSION: Moderate degenerative joint disease of the right acromioclavicular
joint. No acute abnormality seen in the right shoulder.

## 2022-07-02 ENCOUNTER — Telehealth: Payer: Self-pay | Admitting: Family

## 2022-07-02 NOTE — Telephone Encounter (Signed)
Contacted Shaneal Barasch to schedule their annual wellness visit. Appointment made for 07/29/2022.   Thank you,  Judeth Cornfield,  AMB Clinical Support [facility] AWV Program Direct Dial ??[phone]

## 2022-07-29 ENCOUNTER — Ambulatory Visit: Payer: Medicare PPO

## 2022-07-29 VITALS — Ht 65.0 in | Wt 227.0 lb

## 2022-07-29 DIAGNOSIS — Z0000 Encounter for general adult medical examination without abnormal findings: Secondary | ICD-10-CM | POA: Diagnosis not present

## 2022-07-29 NOTE — Progress Notes (Signed)
Subjective:   Sierra Greer is a 72 y.o. female who presents for Medicare Annual (Subsequent) preventive examination. I connected with  Bennett Scrape on 07/29/22 by a audio enabled telemedicine application and verified that I am speaking with the correct person using two identifiers.  Patient Location: Home  Provider Location: Home Office  I discussed the limitations of evaluation and management by telemedicine. The patient expressed understanding and agreed to proceed.  Review of Systems     Cardiac Risk Factors include: advanced age (>57men, >71 women);dyslipidemia     Objective:    Today's Vitals   07/29/22 1416  Weight: 227 lb (103 kg)  Height: 5\' 5"  (1.651 m)   Body mass index is 37.77 kg/m.     07/29/2022    2:19 PM 07/25/2021    2:51 PM 07/24/2020    2:07 PM 04/30/2020    7:53 AM 09/14/2018   10:09 AM 05/13/2015    3:26 PM  Advanced Directives  Does Patient Have a Medical Advance Directive? No No No No No No  Would patient like information on creating a medical advance directive? No - Patient declined Yes (MAU/Ambulatory/Procedural Areas - Information given) Yes (MAU/Ambulatory/Procedural Areas - Information given)  No - Patient declined No - patient declined information    Current Medications (verified) Outpatient Encounter Medications as of 07/29/2022  Medication Sig   carboxymethylcellul-glycerin (REFRESH OPTIVE) 0.5-0.9 % ophthalmic solution 1 drop.   diphenhydrAMINE (BENADRYL) 25 MG tablet Take 50 mg by mouth every 6 (six) hours as needed (Swelling, breathing).   omeprazole (PRILOSEC) 20 MG capsule Take 20 mg by mouth daily.   rosuvastatin (CRESTOR) 5 MG tablet Take 1 tablet (5 mg total) by mouth daily.   No facility-administered encounter medications on file as of 07/29/2022.    Allergies (verified) Penicillins   History: Past Medical History:  Diagnosis Date   Diverticulitis    GERD (gastroesophageal reflux disease)    Hyperlipidemia    Leaky heart  valve    Past Surgical History:  Procedure Laterality Date   ABDOMINAL HYSTERECTOMY     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  Tobacco Use   Smoking status: Former    Packs/day: .5    Types: Cigarettes    Start date: 02/09/1994    Quit date: 01/21/2002    Years since quitting: 20.5   Smokeless tobacco: Never  Vaping Use   Vaping Use: Never used  Substance and Sexual Activity   Alcohol use: No   Drug use: No   Sexual activity: Not Currently    Birth control/protection: Surgical  Other Topics Concern   Not on file  Social History Narrative   Lives alone in a travel trailer in daughter's back yard - severe pain in knees and sometimes shoulders due to arthritis - uses 2 canes to help take stress off of knees while walking    Social Determinants of Health   Financial Resource Strain: Low Risk  (07/29/2022)   Overall Financial Resource Strain (CARDIA)    Difficulty of Paying Living Expenses:  Not hard at all  Food Insecurity: No Food Insecurity (07/29/2022)   Hunger Vital Sign    Worried About Running Out of Food in the Last Year: Never true    Ran Out of Food in the Last Year: Never true  Transportation Needs: No Transportation Needs (07/29/2022)   PRAPARE - Administrator, Civil Service (Medical): No    Lack of Transportation (Non-Medical): No  Physical Activity: Insufficiently Active (07/29/2022)   Exercise Vital Sign    Days of Exercise per Week: 3 days    Minutes of Exercise per Session: 30 min  Stress: No Stress Concern Present (07/29/2022)   [employer] of Occupational Health - Occupational  Stress Questionnaire    Feeling of Stress : Not at all  Social Connections: Socially Isolated (07/29/2022)   Social Connection and Isolation Panel [NHANES]    Frequency of Communication with Friends and Family: More than three times a week    Frequency of Social Gatherings with Friends and Family: More than three times a week    Attends Religious Services: Never    Database administrator or Organizations: No    Attends Engineer, structural: Never    Marital Status: Divorced    Tobacco Counseling Counseling given: Not Answered   Clinical Intake:  Pre-visit preparation completed: Yes  Pain : No/denies pain     Nutritional Risks: None Diabetes: No  How often do you need to have someone help you when you read instructions, pamphlets, or other written materials from your doctor or pharmacy?: 1 - Never  Diabetic?no   Interpreter Needed?: No  Information entered by :: Renie Ora, LPN   Activities of Daily Living    07/29/2022    2:19 PM  In your present state of health, do you have any difficulty performing the following activities:  Hearing? 0  Vision? 0  Difficulty concentrating or making decisions? 0  Walking or climbing stairs? 0  Dressing or bathing? 0  Doing errands, shopping? 0  Preparing Food and eating ? N  Using the Toilet? N  In the past six months, have you accidently leaked urine? N  Do you have problems with loss of bowel control? N  Managing your Medications? N  Managing your Finances? N  Housekeeping or managing your Housekeeping? N    Patient Care Team: Junie Spencer, FNP as PCP - General (Family Medicine) Moody Bruins as Physician Assistant (Chiropractic Medicine) Michaelle Copas, MD as Referring Physician (Optometry)  Indicate any recent Medical Services you may have received from other than [facility] providers in the past year (date may be approximate).     Assessment:   This is a routine wellness examination for  [place].  Hearing/Vision screen Vision Screening - Comments:: Wears rx glasses - up to date with routine eye exams with  Dr.Lee   Dietary issues and exercise activities discussed: Current Exercise Habits: Home exercise routine, Type of exercise: walking, Time (Minutes): 30, Frequency (Times/Week): 3, Weekly Exercise (Minutes/Week): 90, Intensity: Mild, Exercise limited by: orthopedic condition(s)   Goals Addressed             This Visit's Progress    DIET - INCREASE WATER INTAKE   On track    Try to drink 6-8 glasses of water daily.       Depression Screen    07/29/2022    2:18 PM 07/25/2021    2:48 PM 02/08/2021   11:22 AM 07/24/2020  1:57 PM 05/03/2020    2:52 PM 09/14/2018   10:09 AM 12/26/2015    5:32 PM  PHQ 2/9 Scores  PHQ - 2 Score 0 0 0 0 0 0 1    Fall Risk    07/29/2022    2:17 PM 07/25/2021    2:43 PM 07/24/2020    2:07 PM 05/03/2020    2:38 PM 09/14/2018   10:09 AM  Fall Risk   Falls in the past year? 0 0 0 0 0  Number falls in past yr: 0 0 0    Injury with Fall? 0 0 0    Risk for fall due to : No Fall Risks Impaired balance/gait;Orthopedic patient Orthopedic patient;Impaired balance/gait;Impaired vision    Follow up Falls prevention discussed Education provided;Falls prevention discussed Education provided;Falls prevention discussed      FALL RISK PREVENTION PERTAINING TO THE HOME:  Any stairs in or around the home? No  If so, are there any without handrails? No  Home free of loose throw rugs in walkways, pet beds, electrical cords, etc? Yes  Adequate lighting in your home to reduce risk of falls? Yes   ASSISTIVE DEVICES UTILIZED TO PREVENT FALLS:  Life alert? No  Use of a cane, walker or w/c? Yes  Grab bars in the bathroom? No  Shower chair or bench in shower? No  Elevated toilet seat or a handicapped toilet? No          07/29/2022    2:20 PM 07/25/2021    2:51 PM 09/14/2018   10:12 AM  6CIT Screen  What Year? 0 points 0 points 0 points  What  month? 0 points 0 points 0 points  What time? 0 points 0 points 0 points  Count back from 20 0 points 0 points 0 points  Months in reverse 0 points 0 points 0 points  Repeat phrase 0 points 4 points 0 points  Total Score 0 points 4 points 0 points    Immunizations Immunization History  Administered Date(s) Administered   Fluad Quad(high Dose 65+) 05/03/2020, 02/08/2021   Moderna Sars-Covid-2 Vaccination 06/02/2019, 07/05/2019, 02/25/2020   PNEUMOCOCCAL CONJUGATE-20 02/08/2021   Pneumococcal Polysaccharide-23 03/30/2018    TDAP status: Due, Education has been provided regarding the importance of this vaccine. Advised may receive this vaccine at local pharmacy or Health Dept. Aware to provide a copy of the vaccination record if obtained from local pharmacy or Health Dept. Verbalized acceptance and understanding.  Flu Vaccine status: Declined, Education has been provided regarding the importance of this vaccine but patient still declined. Advised may receive this vaccine at local pharmacy or Health Dept. Aware to provide a copy of the vaccination record if obtained from local pharmacy or Health Dept. Verbalized acceptance and understanding.  Pneumococcal vaccine status: Up to date  Covid-19 vaccine status: Completed vaccines  Qualifies for Shingles Vaccine? Yes   Zostavax completed No   Shingrix Completed?: No.    Education has been provided regarding the importance of this vaccine. Patient has been advised to call insurance company to determine out of pocket expense if they have not yet received this vaccine. Advised may also receive vaccine at local pharmacy or Health Dept. Verbalized acceptance and understanding.  Screening Tests Health Maintenance  Topic Date Due   DTaP/Tdap/Td (1 - Tdap) Never done   Zoster Vaccines- Shingrix (1 of 2) Never done   COVID-19 Vaccine (4 - 2023-24 season) 08/14/2022 (Originally 11/08/2021)   MAMMOGRAM  07/29/2023 (Originally 02/10/1999)   DEXA  SCAN   07/29/2023 (Originally 02/09/2014)   COLONOSCOPY (Pts 45-78yrs Insurance coverage will need to be confirmed)  07/29/2023 (Originally 02/09/1994)   INFLUENZA VACCINE  10/09/2022   Medicare Annual Wellness (AWV)  07/29/2023   Pneumonia Vaccine 44+ Years old  Completed   Hepatitis C Screening  Completed   HPV VACCINES  Aged Out    Health Maintenance  Health Maintenance Due  Topic Date Due   DTaP/Tdap/Td (1 - Tdap) Never done   Zoster Vaccines- Shingrix (1 of 2) Never done    Colorectal cancer screening: Referral to GI placed declined . Pt aware the office will call re: appt.  Mammogram status: Ordered declined . Pt provided with contact info and advised to call to schedule appt.   Bone Density status: Ordered declined . Pt provided with contact info and advised to call to schedule appt.  Lung Cancer Screening: (Low Dose CT Chest recommended if Age 31-80 years, 30 pack-year currently smoking OR have quit w/in 15years.) does not qualify.   Lung Cancer Screening Referral: n/a  Additional Screening:  Hepatitis C Screening: does not qualify; Completed 03/30/2018  Vision Screening: Recommended annual ophthalmology exams for early detection of glaucoma and other disorders of the eye. Is the patient up to date with their annual eye exam?  Yes  Who is the provider or what is the name of the office in which the patient attends annual eye exams? Dr.Lee  If pt is not established with a provider, would they like to be referred to a provider to establish care? No .   Dental Screening: Recommended annual dental exams for proper oral hygiene  Community Resource Referral / Chronic Care Management: CRR required this visit?  No   CCM required this visit?  No      Plan:     I have personally reviewed and noted the following in the patient's chart:   Medical and social history Use of alcohol, tobacco or illicit drugs  Current medications and supplements including opioid prescriptions.  Patient is not currently taking opioid prescriptions. Functional ability and status Nutritional status Physical activity Advanced directives List of other physicians Hospitalizations, surgeries, and ER visits in previous 12 months Vitals Screenings to include cognitive, depression, and falls Referrals and appointments  In addition, I have reviewed and discussed with patient certain preventive protocols, quality metrics, and best practice recommendations. A written personalized care plan for preventive services as well as general preventive health recommendations were provided to patient.     Lorrene Reid, LPN   1/61/0960   Nurse Notes: none

## 2022-07-29 NOTE — Patient Instructions (Signed)
Sierra Greer , Thank you for taking time to come for your Medicare Wellness Visit. I appreciate your ongoing commitment to your health goals. Please review the following plan we discussed and let me know if I can assist you in the future.   These are the goals we discussed:  Goals      DIET - INCREASE WATER INTAKE     Try to drink 6-8 glasses of water daily.     Exercise 3x per week (30 min per time)     Low fat, low carb diet - Try to lose 1-2 lbs per week and be more active.        This is a list of the screening recommended for you and due dates:  Health Maintenance  Topic Date Due   DTaP/Tdap/Td vaccine (1 - Tdap) Never done   Zoster (Shingles) Vaccine (1 of 2) Never done   COVID-19 Vaccine (4 - 2023-24 season) 08/14/2022*   Mammogram  07/29/2023*   DEXA scan (bone density measurement)  07/29/2023*   Colon Cancer Screening  07/29/2023*   Flu Shot  10/09/2022   Medicare Annual Wellness Visit  07/29/2023   Pneumonia Vaccine  Completed   Hepatitis C Screening: USPSTF Recommendation to screen - Ages 18-79 yo.  Completed   HPV Vaccine  Aged Out  *Topic was postponed. The date shown is not the original due date.    Advanced directives: Advance directive discussed with you today. I have provided a copy for you to complete at home and have notarized. Once this is complete please bring a copy in to our office so we can scan it into your chart.   Conditions/risks identified: Aim for 30 minutes of exercise or brisk walking, 6-8 glasses of water, and 5 servings of fruits and vegetables each day.   Next appointment: Follow up in one year for your annual wellness visit.   Preventive Care 19 Years and Older, Female  Preventive care refers to lifestyle choices and visits with your health care provider that can promote health and wellness. What does preventive care include? A yearly physical exam. This is also called an annual well check. Dental exams once or twice a year. Routine eye  exams. Ask your health care provider how often you should have your eyes checked. Personal lifestyle choices, including: Daily care of your teeth and gums. Regular physical activity. Eating a healthy diet. Avoiding tobacco and drug use. Limiting alcohol use. Practicing safe sex. Taking low doses of aspirin every day. Taking vitamin and mineral supplements as recommended by your health care provider. What happens during an annual well check? The services and screenings done by your health care provider during your annual well check will depend on your age, overall health, lifestyle risk factors, and family history of disease. Counseling  Your health care provider may ask you questions about your: Alcohol use. Tobacco use. Drug use. Emotional well-being. Home and relationship well-being. Sexual activity. Eating habits. History of falls. Memory and ability to understand (cognition). Work and work Astronomer. Screening  You may have the following tests or measurements: Height, weight, and BMI. Blood pressure. Lipid and cholesterol levels. These may be checked every 5 years, or more frequently if you are over 105 years old. Skin check. Lung cancer screening. You may have this screening every year starting at age 51 if you have a 30-pack-year history of smoking and currently smoke or have quit within the past 15 years. Fecal occult blood test (FOBT) of the stool.  You may have this test every year starting at age 15. Flexible sigmoidoscopy or colonoscopy. You may have a sigmoidoscopy every 5 years or a colonoscopy every 10 years starting at age 67. Prostate cancer screening. Recommendations will vary depending on your family history and other risks. Hepatitis C blood test. Hepatitis B blood test. Sexually transmitted disease (STD) testing. Diabetes screening. This is done by checking your blood sugar (glucose) after you have not eaten for a while (fasting). You may have this done every  1-3 years. Abdominal aortic aneurysm (AAA) screening. You may need this if you are a current or former smoker. Osteoporosis. You may be screened starting at age 75 if you are at high risk. Talk with your health care provider about your test results, treatment options, and if necessary, the need for more tests. Vaccines  Your health care provider may recommend certain vaccines, such as: Influenza vaccine. This is recommended every year. Tetanus, diphtheria, and acellular pertussis (Tdap, Td) vaccine. You may need a Td booster every 10 years. Zoster vaccine. You may need this after age 22. Pneumococcal 13-valent conjugate (PCV13) vaccine. One dose is recommended after age 32. Pneumococcal polysaccharide (PPSV23) vaccine. One dose is recommended after age 31. Talk to your health care provider about which screenings and vaccines you need and how often you need them. This information is not intended to replace advice given to you by your health care provider. Make sure you discuss any questions you have with your health care provider. Document Released: 03/23/2015 Document Revised: 11/14/2015 Document Reviewed: 12/26/2014 Elsevier Interactive Patient Education  2017 [employer].  Fall Prevention in the Home Falls can cause injuries. They can happen to people of all ages. There are many things you can do to make your home safe and to help prevent falls. What can I do on the outside of my home? Regularly fix the edges of walkways and driveways and fix any cracks. Remove anything that might make you trip as you walk through a door, such as a raised step or threshold. Trim any bushes or trees on the path to your home. Use bright outdoor lighting. Clear any walking paths of anything that might make someone trip, such as rocks or tools. Regularly check to see if handrails are loose or broken. Make sure that both sides of any steps have handrails. Any raised decks and porches should have guardrails on  the edges. Have any leaves, snow, or ice cleared regularly. Use sand or salt on walking paths during winter. Clean up any spills in your garage right away. This includes oil or grease spills. What can I do in the bathroom? Use night lights. Install grab bars by the toilet and in the tub and shower. Do not use towel bars as grab bars. Use non-skid mats or decals in the tub or shower. If you need to sit down in the shower, use a plastic, non-slip stool. Keep the floor dry. Clean up any water that spills on the floor as soon as it happens. Remove soap buildup in the tub or shower regularly. Attach bath mats securely with double-sided non-slip rug tape. Do not have throw rugs and other things on the floor that can make you trip. What can I do in the bedroom? Use night lights. Make sure that you have a light by your bed that is easy to reach. Do not use any sheets or blankets that are too big for your bed. They should not hang down onto the floor. Have  a firm chair that has side arms. You can use this for support while you get dressed. Do not have throw rugs and other things on the floor that can make you trip. What can I do in the kitchen? Clean up any spills right away. Avoid walking on wet floors. Keep items that you use a lot in easy-to-reach places. If you need to reach something above you, use a strong step stool that has a grab bar. Keep electrical cords out of the way. Do not use floor polish or wax that makes floors slippery. If you must use wax, use non-skid floor wax. Do not have throw rugs and other things on the floor that can make you trip. What can I do with my stairs? Do not leave any items on the stairs. Make sure that there are handrails on both sides of the stairs and use them. Fix handrails that are broken or loose. Make sure that handrails are as long as the stairways. Check any carpeting to make sure that it is firmly attached to the stairs. Fix any carpet that is loose  or worn. Avoid having throw rugs at the top or bottom of the stairs. If you do have throw rugs, attach them to the floor with carpet tape. Make sure that you have a light switch at the top of the stairs and the bottom of the stairs. If you do not have them, ask someone to add them for you. What else can I do to help prevent falls? Wear shoes that: Do not have high heels. Have rubber bottoms. Are comfortable and fit you well. Are closed at the toe. Do not wear sandals. If you use a stepladder: Make sure that it is fully opened. Do not climb a closed stepladder. Make sure that both sides of the stepladder are locked into place. Ask someone to hold it for you, if possible. Clearly mark and make sure that you can see: Any grab bars or handrails. First and last steps. Where the edge of each step is. Use tools that help you move around (mobility aids) if they are needed. These include: Canes. Walkers. Scooters. Crutches. Turn on the lights when you go into a dark area. Replace any light bulbs as soon as they burn out. Set up your furniture so you have a clear path. Avoid moving your furniture around. If any of your floors are uneven, fix them. If there are any pets around you, be aware of where they are. Review your medicines with your doctor. Some medicines can make you feel dizzy. This can increase your chance of falling. Ask your doctor what other things that you can do to help prevent falls. This information is not intended to replace advice given to you by your health care provider. Make sure you discuss any questions you have with your health care provider. Document Released: 12/21/2008 Document Revised: 08/02/2015 Document Reviewed: 03/31/2014 Elsevier Interactive Patient Education  2017 [employer].

## 2023-07-07 ENCOUNTER — Ambulatory Visit (INDEPENDENT_AMBULATORY_CARE_PROVIDER_SITE_OTHER): Admitting: Nurse Practitioner

## 2023-07-07 ENCOUNTER — Encounter: Payer: Self-pay | Admitting: Nurse Practitioner

## 2023-07-07 VITALS — BP 150/90 | HR 74 | Temp 97.2°F | Ht 65.0 in | Wt 211.0 lb

## 2023-07-07 DIAGNOSIS — J3489 Other specified disorders of nose and nasal sinuses: Secondary | ICD-10-CM

## 2023-07-07 NOTE — Progress Notes (Signed)
   Subjective:    Patient ID: Sierra Greer, female    DOB: September 08, 1950, 73 y.o.   MRN: [id number]   Chief Complaint: Spot on nose (Wants referral to dermatology. Has been there around 2 months/)   HPI  Patient come sin  today with a lesion on nose that she wants referral for. Has been there for about 2-3 months. She has a problem of picking at area. Patient Active Problem List   Diagnosis Date Noted   Gastroesophageal reflux disease with esophagitis 02/08/2021   Chronic bilateral thoracic back pain 02/08/2021   Palpitations 03/16/2013   Hyperlipidemia 03/16/2013   Morbid obesity (HCC) 03/16/2013   H/O diverticulitis of colon 01/21/2013       Review of Systems  Constitutional:  Negative for diaphoresis.  Eyes:  Negative for pain.  Respiratory:  Negative for shortness of breath.   Cardiovascular:  Negative for chest pain, palpitations and leg swelling.  Gastrointestinal:  Negative for abdominal pain.  Endocrine: Negative for polydipsia.  Skin:  Negative for rash.  Neurological:  Negative for dizziness, weakness and headaches.  Hematological:  Does not bruise/bleed easily.  All other systems reviewed and are negative.      Objective:   Physical Exam Constitutional:      Appearance: Normal appearance.  Cardiovascular:     Rate and Rhythm: Normal rate and regular rhythm.     Heart sounds: Normal heart sounds.  Pulmonary:     Effort: Pulmonary effort is normal.     Breath sounds: Normal breath sounds.  Skin:    General: Skin is warm.     Comments: See picture of nose lesion.  Neurological:     General: No focal deficit present.     Mental Status: She is alert and oriented to person, place, and time.  Psychiatric:        Mood and Affect: Mood normal.        Behavior: Behavior normal.     BP (!) 150/90   Pulse 74   Temp (!) 97.2 F (36.2 C) (Temporal)   Ht 5\' 5"  (1.651 m)   Wt 211 lb (95.7 kg)   SpO2 95%   BMI 35.11 kg/m        Assessment & Plan:   Sierra Greer in today with chief complaint of Spot on nose (Wants referral to dermatology. Has been there around 2 months/)   1. Lesion of nose (Primary) Do not pick at lesion Referral to derm RTO prn    The above assessment and management plan was discussed with the patient. The patient verbalized understanding of and has agreed to the management plan. Patient is aware to call the clinic if symptoms persist or worsen. Patient is aware when to return to the clinic for a follow-up visit. Patient educated on when it is appropriate to go to the emergency department.   Sierra Gaylyn Keas, FNP

## 2023-07-28 DIAGNOSIS — L570 Actinic keratosis: Secondary | ICD-10-CM | POA: Diagnosis not present

## 2023-07-28 DIAGNOSIS — X32XXXA Exposure to sunlight, initial encounter: Secondary | ICD-10-CM | POA: Diagnosis not present

## 2023-07-30 ENCOUNTER — Ambulatory Visit (INDEPENDENT_AMBULATORY_CARE_PROVIDER_SITE_OTHER): Payer: Medicare PPO

## 2023-07-30 VITALS — Ht 65.0 in | Wt 211.0 lb

## 2023-07-30 DIAGNOSIS — Z0000 Encounter for general adult medical examination without abnormal findings: Secondary | ICD-10-CM

## 2023-07-30 DIAGNOSIS — Z5320 Procedure and treatment not carried out because of patient's decision for unspecified reasons: Secondary | ICD-10-CM

## 2023-07-30 NOTE — Patient Instructions (Signed)
 Sierra Greer , Thank you for taking time out of your busy schedule to complete your Annual Wellness Visit with me. I enjoyed our conversation and look forward to speaking with you again next year. I, as well as your care team,  appreciate your ongoing commitment to your health goals. Please review the following plan we discussed and let me know if I can assist you in the future. Your Game plan/ To Do List    Referrals: If you haven't heard from the office you've been referred to, please reach out to them at the phone number provided.  N/a Follow up Visits: Next Medicare AWV with our clinical staff:   Aug 02, 2024 at 10am video visit Have you seen your provider in the last 6 months (3 months if uncontrolled diabetes)? Yes Next Office Visit with your provider:   August 31, 2023 at 1:40 pm   Clinician Recommendations:   Aim for 30 minutes of exercise or brisk walking, 6-8 glasses of water, and 5 servings of fruits and vegetables each day.  I enjoyed our conversation today and look forward to talking with you again next year!! Have a wonderful and safe year. All the best, Tyron Manetta This is a list of the screening recommended for you and due dates:  Health Maintenance  Topic Date Due   DEXA scan (bone density measurement)  Never done   Mammogram  Never done   DTaP/Tdap/Td vaccine (1 - Tdap) Never done   Colon Cancer Screening  Never done   Zoster (Shingles) Vaccine (1 of 2) Never done   COVID-19 Vaccine (4 - 2024-25 season) 11/09/2022   Flu Shot  10/09/2023   Medicare Annual Wellness Visit  07/29/2024   Pneumonia Vaccine  Completed   Hepatitis C Screening  Completed   HPV Vaccine  Aged Out   Meningitis B Vaccine  Aged Out    Advanced directives: (Provided) Advance directive discussed with you today. I have provided a copy for you to complete at home and have notarized. Once this is complete, please bring a copy in to our office so we can scan it into your chart.  Information on Advanced Care  Planning can be found at [place]  [employer] Advance Health Care Directives Advance Health Care Directives ([email or web address]    When you have completed your advanced care planning documents, you may either personally take them into the office or mail/email them to the addresses listed below:    Advance Care Planning is important because it:  [x]  Makes sure you receive the medical care that is consistent with your values, goals, and preferences  [x]  It provides guidance to your family and loved ones and reduces their decisional burden about whether or not they are making the right decisions based on your wishes.  Follow the link provided in your after visit summary or read over the paperwork we have mailed to you to help you started getting your Advance Directives in place. If you need assistance in completing these, please reach out to us  so that we can help you! See attachments for Preventive Care and Fall Prevention Tips.  Understanding Your Risk for Falls Millions of people have serious injuries from falls each year. It is important to understand your risk of falling. Talk with your health care provider about your risk and what you can do to lower it. If you do have a serious fall, make sure to tell your provider. Falling once raises your risk of falling again. How can  falls affect me? Serious injuries from falls are common. These include: Broken bones, such as hip fractures. Head injuries, such as traumatic brain injuries (TBI) or concussions. A fear of falling can cause you to avoid activities and stay at home. This can make your muscles weaker and raise your risk for a fall. What can increase my risk? There are a number of risk factors that increase your risk for falling. The more risk factors you have, the higher your risk of falling. Serious injuries from a fall happen most often to people who are older than 75 years old. Teenagers and young adults ages 34-29 are also at higher  risk. Common risk factors include: Weakness in the lower body. Being generally weak or confused due to long-term (chronic) illness. Dizziness or balance problems. Poor vision. Medicines that cause dizziness or drowsiness. These may include: Medicines for your blood pressure, heart, anxiety, insomnia, or swelling (edema). Pain medicines. Muscle relaxants. Other risk factors include: Drinking alcohol. Having had a fall in the past. Having foot pain or wearing improper footwear. Working at a dangerous job. Having any of the following in your home: Tripping hazards, such as floor clutter or loose rugs. Poor lighting. Pets. Having dementia or memory loss. What actions can I take to lower my risk of falling?     Physical activity Stay physically fit. Do strength and balance exercises. Consider taking a regular class to build strength and balance. Yoga and tai chi are good options. Vision Have your eyes checked every year and your prescription for glasses or contacts updated as needed. Shoes and walking aids Wear non-skid shoes. Wear shoes that have rubber soles and low heels. Do not wear high heels. Do not walk around the house in socks or slippers. Use a cane or walker as told by your provider. Home safety Attach secure railings on both sides of your stairs. Install grab bars for your bathtub, shower, and toilet. Use a non-skid mat in your bathtub or shower. Attach bath mats securely with double-sided, non-slip rug tape. Use good lighting in all rooms. Keep a flashlight near your bed. Make sure there is a clear path from your bed to the bathroom. Use night-lights. Do not use throw rugs. Make sure all carpeting is taped or tacked down securely. Remove all clutter from walkways and stairways, including extension cords. Repair uneven or broken steps and floors. Avoid walking on icy or slippery surfaces. Walk on the grass instead of on icy or slick sidewalks. Use ice melter to get  rid of ice on walkways in the winter. Use a cordless phone. Questions to ask your health care provider Can you help me check my risk for a fall? Do any of my medicines make me more likely to fall? Should I take a vitamin D  supplement? What exercises can I do to improve my strength and balance? Should I make an appointment to have my vision checked? Do I need a bone density test to check for weak bones (osteoporosis)? Would it help to use a cane or a walker? Where to find more information Centers for Disease Control and Prevention, STEAD[employer]: TonerPromos.no Community-Based Fall Prevention Programs: TonerPromos.no [employer] on Aging: BaseRingTones.pl Contact a health care provider if: You fall at home. You are afraid of falling at home. You feel weak, drowsy, or dizzy. This information is not intended to replace advice given to you by your health care provider. Make sure you discuss any questions you have with your health care provider. Document  Revised: 10/28/2021 Document Reviewed: 10/28/2021 Elsevier Patient Education  2024 [employer].  Understanding Your Risk for Falls Millions of people have serious injuries from falls each year. It is important to understand your risk of falling. Talk with your health care provider about your risk and what you can do to lower it. If you do have a serious fall, make sure to tell your provider. Falling once raises your risk of falling again. How can falls affect me? Serious injuries from falls are common. These include: Broken bones, such as hip fractures. Head injuries, such as traumatic brain injuries (TBI) or concussions. A fear of falling can cause you to avoid activities and stay at home. This can make your muscles weaker and raise your risk for a fall. What can increase my risk? There are a number of risk factors that increase your risk for falling. The more risk factors you have, the higher your risk of falling. Serious injuries from a fall happen most  often to people who are older than 75 years old. Teenagers and young adults ages 24-29 are also at higher risk. Common risk factors include: Weakness in the lower body. Being generally weak or confused due to long-term (chronic) illness. Dizziness or balance problems. Poor vision. Medicines that cause dizziness or drowsiness. These may include: Medicines for your blood pressure, heart, anxiety, insomnia, or swelling (edema). Pain medicines. Muscle relaxants. Other risk factors include: Drinking alcohol. Having had a fall in the past. Having foot pain or wearing improper footwear. Working at a dangerous job. Having any of the following in your home: Tripping hazards, such as floor clutter or loose rugs. Poor lighting. Pets. Having dementia or memory loss. What actions can I take to lower my risk of falling?     Physical activity Stay physically fit. Do strength and balance exercises. Consider taking a regular class to build strength and balance. Yoga and tai chi are good options. Vision Have your eyes checked every year and your prescription for glasses or contacts updated as needed. Shoes and walking aids Wear non-skid shoes. Wear shoes that have rubber soles and low heels. Do not wear high heels. Do not walk around the house in socks or slippers. Use a cane or walker as told by your provider. Home safety Attach secure railings on both sides of your stairs. Install grab bars for your bathtub, shower, and toilet. Use a non-skid mat in your bathtub or shower. Attach bath mats securely with double-sided, non-slip rug tape. Use good lighting in all rooms. Keep a flashlight near your bed. Make sure there is a clear path from your bed to the bathroom. Use night-lights. Do not use throw rugs. Make sure all carpeting is taped or tacked down securely. Remove all clutter from walkways and stairways, including extension cords. Repair uneven or broken steps and floors. Avoid walking on  icy or slippery surfaces. Walk on the grass instead of on icy or slick sidewalks. Use ice melter to get rid of ice on walkways in the winter. Use a cordless phone. Questions to ask your health care provider Can you help me check my risk for a fall? Do any of my medicines make me more likely to fall? Should I take a vitamin D  supplement? What exercises can I do to improve my strength and balance? Should I make an appointment to have my vision checked? Do I need a bone density test to check for weak bones (osteoporosis)? Would it help to use a cane or a  walker? Where to find more information Centers for Disease Control and Prevention, STEAD[employer]: TonerPromos.no Community-Based Fall Prevention Programs: TonerPromos.no [employer] on Aging: BaseRingTones.pl Contact a health care provider if: You fall at home. You are afraid of falling at home. You feel weak, drowsy, or dizzy. This information is not intended to replace advice given to you by your health care provider. Make sure you discuss any questions you have with your health care provider. Document Revised: 10/28/2021 Document Reviewed: 10/28/2021 Elsevier Patient Education  2024 [employer].

## 2023-07-30 NOTE — Progress Notes (Signed)
 Subjective:   Sierra Greer is a 73 y.o. who presents for a Medicare Wellness preventive visit.  As a reminder, Annual Wellness Visits don't include a physical exam, and some assessments may be limited, especially if this visit is performed virtually. We may recommend an in-person follow-up visit with your provider if needed.  Visit Complete: Virtual I connected with  Sierra Greer on 07/30/23 by a audio enabled telemedicine application and verified that I am speaking with the correct person using two identifiers.  Patient Location: Home  Provider Location: Home Office  I discussed the limitations of evaluation and management by telemedicine. The patient expressed understanding and agreed to proceed.  Vital Signs: Because this visit was a virtual/telehealth visit, some criteria may be missing or patient reported. Any vitals not documented were not able to be obtained and vitals that have been documented are patient reported.  VideoDeclined- This patient declined Librarian, academic. Therefore the visit was completed with audio only.  Persons Participating in Visit: Patient.  AWV Questionnaire: No: Patient Medicare AWV questionnaire was not completed prior to this visit.  Cardiac Risk Factors include: advanced age (>67men, >60 women);dyslipidemia;obesity (BMI >30kg/m2)     Objective:     Today's Vitals   07/30/23 1339  Weight: 211 lb (95.7 kg)  Height: 5\' 5"  (1.651 m)   Body mass index is 35.11 kg/m.     07/30/2023    2:21 PM 07/29/2022    2:19 PM 07/25/2021    2:51 PM 07/24/2020    2:07 PM 04/30/2020    7:53 AM 09/14/2018   10:09 AM 05/13/2015    3:26 PM  Advanced Directives  Does Patient Have a Medical Advance Directive? No No No No No No No  Would patient like information on creating a medical advance directive? Yes (MAU/Ambulatory/Procedural Areas - Information given) No - Patient declined Yes (MAU/Ambulatory/Procedural Areas - Information given)  Yes (MAU/Ambulatory/Procedural Areas - Information given)  No - Patient declined No - patient declined information    Current Medications (verified) Outpatient Encounter Medications as of 07/30/2023  Medication Sig   carboxymethylcellul-glycerin (REFRESH OPTIVE) 0.5-0.9 % ophthalmic solution 1 drop.   diphenhydrAMINE (BENADRYL) 25 MG tablet Take 50 mg by mouth every 6 (six) hours as needed (Swelling, breathing).   omeprazole (PRILOSEC) 20 MG capsule Take 20 mg by mouth daily.   rosuvastatin  (CRESTOR ) 5 MG tablet Take 1 tablet (5 mg total) by mouth daily.   No facility-administered encounter medications on file as of 07/30/2023.    Allergies (verified) Penicillins   History: Past Medical History:  Diagnosis Date   Diverticulitis    GERD (gastroesophageal reflux disease)    Hyperlipidemia    Leaky heart valve    Past Surgical History:  Procedure Laterality Date   ABDOMINAL HYSTERECTOMY     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  Tobacco Use   Smoking status: Former    Current packs/day: 0.00    Average packs/day: 0.5 packs/day for 7.9 years (4.0 ttl pk-yrs)    Types: Cigarettes  Start date: 02/09/1994    Quit date: 01/21/2002    Years since quitting: 21.5   Smokeless tobacco: Never  Vaping Use   Vaping status: Never Used  Substance and Sexual Activity   Alcohol use: No   Drug use: No   Sexual activity: Not Currently    Birth control/protection: Surgical  Other Topics Concern   Not on file  Social History Narrative   Lives alone in a travel trailer in daughter's back yard - severe pain in knees and  sometimes shoulders due to arthritis - uses 2 canes to help take stress off of knees while walking    Social Drivers of Health   Financial Resource Strain: Low Risk  (07/30/2023)   Overall Financial Resource Strain (CARDIA)    Difficulty of Paying Living Expenses: Not hard at all  Food Insecurity: No Food Insecurity (07/30/2023)   Hunger Vital Sign    Worried About Running Out of Food in the Last Year: Never true    Ran Out of Food in the Last Year: Never true  Transportation Needs: No Transportation Needs (07/30/2023)   PRAPARE - Administrator, Civil Service (Medical): No    Lack of Transportation (Non-Medical): No  Physical Activity: Insufficiently Active (07/30/2023)   Exercise Vital Sign    Days of Exercise per Week: 7 days    Minutes of Exercise per Session: 20 min  Stress: No Stress Concern Present (07/30/2023)   [name]    Feeling of Stress : Not at all  Social Connections: Socially Isolated (07/30/2023)   Social Connection and Isolation Panel [NHANES]    Frequency of Communication with Friends and Family: More than three times a week    Frequency of Social Gatherings with Friends and Family: More than three times a week    Attends Religious Services: Never    Database administrator or Organizations: No    Attends Engineer, structural: Never    Marital Status: Divorced    Tobacco Counseling Counseling given: Yes    Clinical Intake:  Pre-visit preparation completed: Yes  Pain : No/denies pain     BMI - recorded: 35.11 Nutritional Status: BMI > 30  Obese Nutritional Risks: None Diabetes: No  No results found for: "HGBA1C"   How often do you need to have someone help you when you read instructions, pamphlets, or other written materials from your doctor or pharmacy?: 1 - Never  Interpreter Needed?: No  Information entered by :: Sally Crazier CMA   Activities of Daily Living      07/30/2023    2:19 PM  In your present state of health, do you have any difficulty performing the following activities:  Hearing? 0  Vision? 0  Difficulty concentrating or making decisions? 0  Walking or climbing stairs? 0  Dressing or bathing? 0  Doing errands, shopping? 0  Preparing Food and eating ? N  Using the Toilet? N  In the past six months, have you accidently leaked urine? N  Do you have problems with loss of bowel control? N  Managing your Medications? N  Managing your Finances? N  Housekeeping or managing your Housekeeping? N    Patient Care Team: Yevette Hem, FNP as PCP - General (Family Medicine) Erwin Heck as Physician Assistant (Chiropractic Medicine) Hyland Mailman, MD as Referring Physician (Optometry)  Indicate any recent Medical Services you may have received from other than [facility] providers  in the past year (date may be approximate).     Assessment:    This is a routine wellness examination for [place].  Hearing/Vision screen Hearing Screening - Comments:: Patient denies any hearing difficulties.   Vision Screening - Comments:: Wears rx glasses - up to date with routine eye exams  Patient sees Dr. Buck Carbon in [facility]    Goals Addressed             This Visit's Progress    Patient Stated       I'd like to work towards getting a car to travel in to visit my children       Depression Screen     07/30/2023    2:23 PM 07/07/2023    8:36 AM 07/29/2022    2:18 PM 07/25/2021    2:48 PM 02/08/2021   11:22 AM 07/24/2020    1:57 PM 05/03/2020    2:52 PM  PHQ 2/9 Scores  PHQ - 2 Score 0 0 0 0 0 0 0  PHQ- 9 Score 0          Fall Risk     07/30/2023    2:20 PM 07/07/2023    8:36 AM 07/29/2022    2:17 PM 07/25/2021    2:43 PM 07/24/2020    2:07 PM  Fall Risk   Falls in the past year? 0 0 0 0 0  Number falls in past yr: 0  0 0 0  Injury with Fall? 0  0 0 0  Risk for fall due to : No Fall Risks  No Fall Risks Impaired  balance/gait;Orthopedic patient Orthopedic patient;Impaired balance/gait;Impaired vision  Follow up Falls evaluation completed  Falls prevention discussed Education provided;Falls prevention discussed Education provided;Falls prevention discussed    MEDICARE RISK AT HOME:  Medicare Risk at Home Any stairs in or around the home?: No If so, are there any without handrails?: No Home free of loose throw rugs in walkways, pet beds, electrical cords, etc?: Yes Adequate lighting in your home to reduce risk of falls?: Yes Life alert?: No Use of a cane, walker or w/c?: Yes Grab bars in the bathroom?: No Shower chair or bench in shower?: No Elevated toilet seat or a handicapped toilet?: No  TIMED UP AND GO:  Was the test performed?  No  Cognitive Function: 6CIT completed        07/30/2023    2:21 PM 07/29/2022    2:20 PM 07/25/2021    2:51 PM 09/14/2018   10:12 AM  6CIT Screen  What Year? 0 points 0 points 0 points 0 points  What month? 0 points 0 points 0 points 0 points  What time? 0 points 0 points 0 points 0 points  Count back from 20 0 points 0 points 0 points 0 points  Months in reverse 0 points 0 points 0 points 0 points  Repeat phrase 0 points 0 points 4 points 0 points  Total Score 0 points 0 points 4 points 0 points    Immunizations Immunization History  Administered Date(s) Administered   Fluad Quad(high Dose 65+) 05/03/2020, 02/08/2021   Moderna Sars-Covid-2 Vaccination 06/02/2019, 07/05/2019, 02/25/2020   PNEUMOCOCCAL CONJUGATE-20 02/08/2021   Pneumococcal Polysaccharide-23 03/30/2018    Screening Tests Health Maintenance  Topic Date Due   DEXA SCAN  Never done   MAMMOGRAM  Never done   DTaP/Tdap/Td (1 - Tdap) Never done   Colonoscopy  Never done   Zoster Vaccines- Shingrix (1 of 2) Never done  COVID-19 Vaccine (4 - 2024-25 season) 11/09/2022   INFLUENZA VACCINE  10/09/2023   Medicare Annual Wellness (AWV)  07/29/2024   Pneumonia Vaccine 75+ Years old   Completed   Hepatitis C Screening  Completed   HPV VACCINES  Aged Out   Meningococcal B Vaccine  Aged Out    Health Maintenance  Health Maintenance Due  Topic Date Due   DEXA SCAN  Never done   MAMMOGRAM  Never done   DTaP/Tdap/Td (1 - Tdap) Never done   Colonoscopy  Never done   Zoster Vaccines- Shingrix (1 of 2) Never done   COVID-19 Vaccine (4 - 2024-25 season) 11/09/2022   Health Maintenance Items Addressed: Patient declined colon cancer  Additional Screening:  Vision Screening: Recommended annual ophthalmology exams for early detection of glaucoma and other disorders of the eye.  Dental Screening: Recommended annual dental exams for proper oral hygiene  Community Resource Referral / Chronic Care Management: CRR required this visit?  No   CCM required this visit?  No   Plan:    I have personally reviewed and noted the following in the patient's chart:   Medical and social history Use of alcohol, tobacco or illicit drugs  Current medications and supplements including opioid prescriptions. Patient is not currently taking opioid prescriptions. Functional ability and status Nutritional status Physical activity Advanced directives List of other physicians Hospitalizations, surgeries, and ER visits in previous 12 months Vitals Screenings to include cognitive, depression, and falls Referrals and appointments  In addition, I have reviewed and discussed with patient certain preventive protocols, quality metrics, and best practice recommendations. A written personalized care plan for preventive services as well as general preventive health recommendations were provided to patient.   Ceri Mayer, CMA   07/30/2023   After Visit Summary: (MyChart) Due to this being a telephonic visit, the after visit summary with patients personalized plan was offered to patient via MyChart   Notes: Nothing significant to report at this time.

## 2023-08-31 ENCOUNTER — Ambulatory Visit: Admitting: Family

## 2023-09-01 ENCOUNTER — Encounter: Payer: Self-pay | Admitting: Family

## 2023-09-15 DIAGNOSIS — C44311 Basal cell carcinoma of skin of nose: Secondary | ICD-10-CM | POA: Diagnosis not present

## 2023-10-26 ENCOUNTER — Ambulatory Visit: Admitting: Family

## 2023-10-27 DIAGNOSIS — L570 Actinic keratosis: Secondary | ICD-10-CM | POA: Diagnosis not present

## 2023-10-27 DIAGNOSIS — X32XXXD Exposure to sunlight, subsequent encounter: Secondary | ICD-10-CM | POA: Diagnosis not present

## 2023-10-27 DIAGNOSIS — Z85828 Personal history of other malignant neoplasm of skin: Secondary | ICD-10-CM | POA: Diagnosis not present

## 2023-10-27 DIAGNOSIS — Z08 Encounter for follow-up examination after completed treatment for malignant neoplasm: Secondary | ICD-10-CM | POA: Diagnosis not present

## 2023-11-04 ENCOUNTER — Ambulatory Visit: Payer: Self-pay | Admitting: *Deleted

## 2023-11-04 NOTE — Telephone Encounter (Signed)
 FYI Only or Action Required?: FYI only for provider.  Patient was last seen in primary care on 07/07/2023 by Gladis Mustard, FNP.  Called Nurse Triage reporting Urinary Urgency.  Symptoms began several days ago.  Interventions attempted: Rest, hydration, or home remedies.  Symptoms are: gradually worsening.  Triage Disposition: See Physician Within 24 Hours  Patient/caregiver understands and will follow disposition?: Yes   Reason for Disposition  Urinating more frequently than usual (i.e., frequency) OR new-onset of the feeling of an urgent need to urinate (i.e., urgency)  Answer Assessment - Initial Assessment Questions 1. SYMPTOM: What's the main symptom you're concerned about? (e.g., frequency, incontinence)     Urgency with urination 2. ONSET: When did the  urgency  start?     Sunday 3. PAIN: Is there any pain? If Yes, ask: How bad is it? (Scale: 1-10; mild, moderate, severe)     no 4. CAUSE: What do you think is causing the symptoms?     UTI 5. OTHER SYMPTOMS: Do you have any other symptoms? (e.g., blood in urine, fever, flank pain, pain with urination)     Not emptying completely  Protocols used: Urinary Symptoms-A-AH  Copied from CRM #[id number]. Topic: Clinical - Red Word Triage >> Nov 04, 2023  2:25 PM Emylou G wrote: [facility] that prompted transfer to Nurse Triage: UTI?  Sense of urgency to pee.SABRA

## 2023-11-04 NOTE — Telephone Encounter (Signed)
 Apt scheduled.

## 2023-11-05 ENCOUNTER — Encounter: Payer: Self-pay | Admitting: Family Medicine

## 2023-11-05 ENCOUNTER — Ambulatory Visit (INDEPENDENT_AMBULATORY_CARE_PROVIDER_SITE_OTHER): Admitting: Family Medicine

## 2023-11-05 VITALS — BP 125/73 | HR 67 | Temp 97.6°F | Ht 65.0 in | Wt 211.8 lb

## 2023-11-05 DIAGNOSIS — N3000 Acute cystitis without hematuria: Secondary | ICD-10-CM | POA: Diagnosis not present

## 2023-11-05 LAB — URINALYSIS, ROUTINE W REFLEX MICROSCOPIC
Bilirubin, UA: NEGATIVE
Glucose, UA: NEGATIVE
Ketones, UA: NEGATIVE
Nitrite, UA: NEGATIVE
Protein,UA: NEGATIVE
RBC, UA: NEGATIVE
Specific Gravity, UA: 1.025 (ref 1.005–1.030)
Urobilinogen, Ur: 0.2 mg/dL (ref 0.2–1.0)
pH, UA: 5.5 (ref 5.0–7.5)

## 2023-11-05 LAB — MICROSCOPIC EXAMINATION
RBC, Urine: NONE SEEN /HPF (ref 0–2)
Renal Epithel, UA: NONE SEEN /HPF
Yeast, UA: NONE SEEN

## 2023-11-05 MED ORDER — CEPHALEXIN 500 MG PO CAPS: 500.0000 mg | ORAL_CAPSULE | Freq: Four times a day (QID) | ORAL | 0 refills | Status: AC

## 2023-11-05 NOTE — Progress Notes (Signed)
 BP 125/73   Pulse 67   Temp 97.6 F (36.4 C) (Temporal)   Ht 5' 5 (1.651 m)   Wt 211 lb 12.8 oz (96.1 kg)   SpO2 96%   BMI 35.25 kg/m    Subjective:   Patient ID: [name], female    DOB: 1950-10-03, 73 y.o.   MRN: [id number]  HPI: Sierra Greer is a 73 y.o. female presenting on 11/05/2023 for Urinary Frequency (Started sunday) and Dysuria   Discussed the use of AI scribe software for clinical note transcription with the patient, who gave verbal consent to proceed.  History of Present Illness   Sierra Greer is a 73 year old female who presents with urinary issues suggestive of a urinary tract infection.  She experiences symptoms similar to previous urinary tract infections, including a frequent urge to urinate despite minimal fluid intake and a sensation of incomplete bladder emptying. These symptoms began on Sunday after returning from a trip, where she was unable to use the bathroom as needed.  She describes mild dysuria and notes that sometimes urination 'almost takes my breath away.' No hematuria, renal pain, flank pain, fevers, chills, or vaginal symptoms such as discharge, bleeding, or irritation.          Relevant past medical, surgical, family and social history reviewed and updated as indicated. Interim medical history since our last visit reviewed. Allergies and medications reviewed and updated.  Review of Systems  Constitutional:  Negative for chills and fever.  Eyes:  Negative for visual disturbance.  Respiratory:  Negative for chest tightness and shortness of breath.   Cardiovascular:  Negative for chest pain and leg swelling.  Gastrointestinal:  Negative for abdominal pain.  Genitourinary:  Positive for dysuria, frequency and urgency. Negative for difficulty urinating, flank pain, hematuria, vaginal bleeding, vaginal discharge and vaginal pain.  Musculoskeletal:  Negative for back pain and gait problem.  Skin:  Negative for rash.  Neurological:   Negative for light-headedness and headaches.  Psychiatric/Behavioral:  Negative for agitation and behavioral problems.   All other systems reviewed and are negative.   Per HPI unless specifically indicated above   Allergies as of 11/05/2023       Reactions   Penicillins Other (See Comments)   Has patient had a PCN reaction causing immediate rash, facial/tongue/throat swelling, SOB or lightheadedness with hypotension: No Has patient had a PCN reaction causing severe rash involving mucus membranes or skin necrosis: No Has patient had a PCN reaction that required hospitalization No Has patient had a PCN reaction occurring within the last 10 years: No If all of the above answers are NO, then may proceed with Cephalosporin use. Childhood Reaction; Developed an abscess at injection site        Medication List        Accurate as of November 05, 2023 10:44 AM. If you have any questions, ask your nurse or doctor.          carboxymethylcellul-glycerin 0.5-0.9 % ophthalmic solution Commonly known as: REFRESH OPTIVE 1 drop.   cephALEXin  500 MG capsule Commonly known as: KEFLEX  Take 1 capsule (500 mg total) by mouth 4 (four) times daily. Started by: Fonda LABOR Elloise Roark   diphenhydrAMINE 25 MG tablet Commonly known as: BENADRYL Take 50 mg by mouth every 6 (six) hours as needed (Swelling, breathing).   omeprazole 20 MG capsule Commonly known as: PRILOSEC Take 20 mg by mouth daily.   rosuvastatin  5 MG tablet Commonly known as: Crestor  Take 1 tablet (5  mg total) by mouth daily.         Objective:   BP 125/73   Pulse 67   Temp 97.6 F (36.4 C) (Temporal)   Ht 5' 5 (1.651 m)   Wt 211 lb 12.8 oz (96.1 kg)   SpO2 96%   BMI 35.25 kg/m   Wt Readings from Last 3 Encounters:  11/05/23 211 lb 12.8 oz (96.1 kg)  07/30/23 211 lb (95.7 kg)  07/07/23 211 lb (95.7 kg)    Physical Exam Abdominal:     General: Abdomen is flat. Bowel sounds are normal. There is no distension.      Palpations: Abdomen is soft.     Tenderness: There is no abdominal tenderness. There is no right CVA tenderness, left CVA tenderness, guarding or rebound.    Physical Exam   CHEST: Lungs clear to auscultation bilaterally. CARDIOVASCULAR: Heart regular rate and rhythm, normal heart sounds.       Urinalysis shows 11-30 WBCs few bacteria and 2+ leukocytes  Assessment & Plan:   Problem List Items Addressed This Visit   None Visit Diagnoses       Acute cystitis without hematuria    -  Primary   Relevant Medications   cephALEXin  (KEFLEX ) 500 MG capsule   Other Relevant Orders   Urinalysis, Routine w reflex microscopic          Urinary tract infection Symptoms and urinalysis findings support urinary tract infection diagnosis.  Senile purpura Bruising attributed to senile purpura due to age-related skin thinning. - Explained increased bruising risk due to fragile blood vessels.          Follow up plan: Return if symptoms worsen or fail to improve.  Counseling provided for all of the vaccine components Orders Placed This Encounter  Procedures   Urinalysis, Routine w reflex microscopic    Fonda Levins, MD [facility] 11/05/2023, 10:44 AM

## 2023-11-06 ENCOUNTER — Ambulatory Visit: Payer: Self-pay | Admitting: Family Medicine

## 2023-12-01 DIAGNOSIS — Z08 Encounter for follow-up examination after completed treatment for malignant neoplasm: Secondary | ICD-10-CM | POA: Diagnosis not present

## 2023-12-01 DIAGNOSIS — Z1283 Encounter for screening for malignant neoplasm of skin: Secondary | ICD-10-CM | POA: Diagnosis not present

## 2023-12-01 DIAGNOSIS — Z85828 Personal history of other malignant neoplasm of skin: Secondary | ICD-10-CM | POA: Diagnosis not present

## 2023-12-01 DIAGNOSIS — L570 Actinic keratosis: Secondary | ICD-10-CM | POA: Diagnosis not present

## 2023-12-01 DIAGNOSIS — D225 Melanocytic nevi of trunk: Secondary | ICD-10-CM | POA: Diagnosis not present

## 2023-12-01 DIAGNOSIS — X32XXXD Exposure to sunlight, subsequent encounter: Secondary | ICD-10-CM | POA: Diagnosis not present

## 2024-03-08 ENCOUNTER — Ambulatory Visit: Admitting: Family Medicine

## 2024-03-08 VITALS — BP 138/82 | HR 74 | Temp 97.5°F | Ht 65.0 in | Wt 218.0 lb

## 2024-03-08 DIAGNOSIS — R82998 Other abnormal findings in urine: Secondary | ICD-10-CM

## 2024-03-08 DIAGNOSIS — Z23 Encounter for immunization: Secondary | ICD-10-CM

## 2024-03-08 DIAGNOSIS — R300 Dysuria: Secondary | ICD-10-CM | POA: Diagnosis not present

## 2024-03-08 LAB — URINALYSIS, ROUTINE W REFLEX MICROSCOPIC
Bilirubin, UA: NEGATIVE
Glucose, UA: NEGATIVE
Ketones, UA: NEGATIVE
Nitrite, UA: NEGATIVE
Protein,UA: NEGATIVE
RBC, UA: NEGATIVE
Specific Gravity, UA: 1.02 (ref 1.005–1.030)
Urobilinogen, Ur: 0.2 mg/dL (ref 0.2–1.0)
pH, UA: 5.5 (ref 5.0–7.5)

## 2024-03-08 LAB — MICROSCOPIC EXAMINATION
RBC, Urine: NONE SEEN /HPF (ref 0–2)
Renal Epithel, UA: NONE SEEN /HPF
Yeast, UA: NONE SEEN

## 2024-03-08 MED ORDER — SULFAMETHOXAZOLE-TRIMETHOPRIM 800-160 MG PO TABS: 1.0000 | ORAL_TABLET | Freq: Two times a day (BID) | ORAL | 0 refills | Status: AC

## 2024-03-08 NOTE — Progress Notes (Signed)
 "  Acute Office Visit  Patient ID: Sierra Greer Date, female    DOB: 1950/04/30, 73 y.o.   MRN: [id number]  PCP: Lavell Bari LABOR, FNP  Chief Complaint  Patient presents with   Dysuria    Subjective:     Dysuria     Discussed the use of AI scribe software for clinical note transcription with the patient, who gave verbal consent to proceed.  History of Present Illness   Sierra Greer is a 73 year old female who presents with burning during urination and frequent urination.  Dysuria and urinary frequency - Burning sensation during urination and increased urinary frequency for the past four days - No hematuria - No vaginal discharge  - No constipation; normal bowel movements  History of urinary tract infections - Prior episodes of urinary tract infection, most recent several months ago - Previous treatment with Keflex  - Urinalysis during last episode showed leukocytes, similar to current symptoms - No urine culture performed during last episode  Blood pressure status - Blood pressure typically normal at home - Elevated readings in office attributed to 'white coat syndrome' and recent physical activity - Not currently taking antihypertensive medication       Review of Systems  Genitourinary:  Positive for dysuria.       Objective:    BP 138/82   Pulse 74   Temp (!) 97.5 F (36.4 C)   Ht 5' 5 (1.651 m)   Wt 218 lb (98.9 kg)   SpO2 96%   BMI 36.28 kg/m    Physical Exam Vitals reviewed.  Constitutional:      Appearance: Normal appearance.  HENT:     Head: Normocephalic and atraumatic.  Eyes:     Extraocular Movements: Extraocular movements intact.     Conjunctiva/sclera: Conjunctivae normal.     Pupils: Pupils are equal, round, and reactive to light.  Cardiovascular:     Rate and Rhythm: Normal rate and regular rhythm.     Pulses: Normal pulses.     Heart sounds: Normal heart sounds. No murmur heard. Pulmonary:     Effort: Pulmonary effort is normal. No  respiratory distress.     Breath sounds: Normal breath sounds.  Abdominal:     Tenderness: There is no abdominal tenderness.  Musculoskeletal:        General: No deformity. Normal range of motion.     Cervical back: Normal range of motion.  Skin:    General: Skin is warm and dry.  Neurological:     General: No focal deficit present.     Mental Status: She is alert and oriented to person, place, and time.  Psychiatric:        Mood and Affect: Mood normal.        Behavior: Behavior normal.       No results found for any visits on 03/08/24.     Assessment & Plan:   Problem List Items Addressed This Visit   None Visit Diagnoses       Dysuria    -  Primary   Relevant Medications   sulfamethoxazole -trimethoprim  (BACTRIM  DS) 800-160 MG tablet   Other Relevant Orders   Urinalysis, Routine w reflex microscopic   Urine Culture     Leukocytes in urine       Relevant Medications   sulfamethoxazole -trimethoprim  (BACTRIM  DS) 800-160 MG tablet       Assessment and Plan    Acute urinary tract infection UA positive for leuks and bacteria. Negative for nitrites  or blood. UA today similar to previous one treated for UTI on 11/05/23.  - Prescribed bactrim  BID x 5 days for possible UTI. - Sent urine sample for culture, results pending. - Discontinue antibiotics if culture negative. - Adjust antibiotics based on sensitivity if culture positive.  - Follow up if symptoms acutely worsen, no improvement over the next 2-3 days, fever develops, or for any other concerns        Meds ordered this encounter  Medications   sulfamethoxazole -trimethoprim  (BACTRIM  DS) 800-160 MG tablet    Sig: Take 1 tablet by mouth 2 (two) times daily for 5 days.    Dispense:  10 tablet    Refill:  0    Supervising Provider:   JOLINDA NORENE HERO [[id number]]    Return if symptoms worsen or fail to improve.  Oneil LELON Severin, FNP [facility]   "

## 2024-03-08 NOTE — Patient Instructions (Signed)
 Follow up if symptoms acutely worsen, no improvement over the next 2-3 days, fever develops, or for any other concerns

## 2024-03-11 ENCOUNTER — Ambulatory Visit: Payer: Self-pay | Admitting: Family Medicine

## 2024-03-11 LAB — URINE CULTURE

## 2024-08-02 ENCOUNTER — Ambulatory Visit: Payer: Self-pay
